# Patient Record
Sex: Female | Born: 1960 | Race: Black or African American | Hispanic: No | Marital: Married
Health system: Southern US, Community
[De-identification: ages and names within clinical notes are randomized; demographics above are authoritative.]

---

## 2008-05-11 ENCOUNTER — Other Ambulatory Visit: Payer: Self-pay

## 2008-05-12 ENCOUNTER — Inpatient Hospital Stay: Payer: Self-pay | Admitting: Internal Medicine

## 2019-12-04 ENCOUNTER — Emergency Department: Payer: Medicaid Other

## 2019-12-04 ENCOUNTER — Inpatient Hospital Stay: Payer: Medicaid Other

## 2019-12-04 ENCOUNTER — Other Ambulatory Visit: Payer: Self-pay

## 2019-12-04 ENCOUNTER — Inpatient Hospital Stay
Admission: EM | Admit: 2019-12-04 | Discharge: 2019-12-26 | DRG: 871 | Disposition: E | Payer: Medicaid Other | Attending: Pulmonary Disease | Admitting: Pulmonary Disease

## 2019-12-04 DIAGNOSIS — J9602 Acute respiratory failure with hypercapnia: Secondary | ICD-10-CM | POA: Diagnosis present

## 2019-12-04 DIAGNOSIS — K7201 Acute and subacute hepatic failure with coma: Secondary | ICD-10-CM

## 2019-12-04 DIAGNOSIS — N179 Acute kidney failure, unspecified: Secondary | ICD-10-CM | POA: Diagnosis present

## 2019-12-04 DIAGNOSIS — R188 Other ascites: Secondary | ICD-10-CM | POA: Diagnosis present

## 2019-12-04 DIAGNOSIS — A419 Sepsis, unspecified organism: Principal | ICD-10-CM | POA: Diagnosis present

## 2019-12-04 DIAGNOSIS — R627 Adult failure to thrive: Secondary | ICD-10-CM | POA: Diagnosis present

## 2019-12-04 DIAGNOSIS — D649 Anemia, unspecified: Secondary | ICD-10-CM | POA: Diagnosis present

## 2019-12-04 DIAGNOSIS — J189 Pneumonia, unspecified organism: Secondary | ICD-10-CM | POA: Diagnosis present

## 2019-12-04 DIAGNOSIS — E46 Unspecified protein-calorie malnutrition: Secondary | ICD-10-CM | POA: Diagnosis present

## 2019-12-04 DIAGNOSIS — K7200 Acute and subacute hepatic failure without coma: Secondary | ICD-10-CM | POA: Diagnosis present

## 2019-12-04 DIAGNOSIS — Z66 Do not resuscitate: Secondary | ICD-10-CM | POA: Diagnosis not present

## 2019-12-04 DIAGNOSIS — Z452 Encounter for adjustment and management of vascular access device: Secondary | ICD-10-CM

## 2019-12-04 DIAGNOSIS — R6521 Severe sepsis with septic shock: Secondary | ICD-10-CM | POA: Diagnosis present

## 2019-12-04 DIAGNOSIS — J9601 Acute respiratory failure with hypoxia: Secondary | ICD-10-CM | POA: Diagnosis present

## 2019-12-04 DIAGNOSIS — Z515 Encounter for palliative care: Secondary | ICD-10-CM | POA: Diagnosis not present

## 2019-12-04 DIAGNOSIS — Z20828 Contact with and (suspected) exposure to other viral communicable diseases: Secondary | ICD-10-CM | POA: Diagnosis present

## 2019-12-04 DIAGNOSIS — G9340 Encephalopathy, unspecified: Secondary | ICD-10-CM | POA: Diagnosis present

## 2019-12-04 DIAGNOSIS — E872 Acidosis, unspecified: Secondary | ICD-10-CM

## 2019-12-04 DIAGNOSIS — G9341 Metabolic encephalopathy: Secondary | ICD-10-CM | POA: Diagnosis present

## 2019-12-04 DIAGNOSIS — D689 Coagulation defect, unspecified: Secondary | ICD-10-CM | POA: Diagnosis present

## 2019-12-04 DIAGNOSIS — Z6824 Body mass index (BMI) 24.0-24.9, adult: Secondary | ICD-10-CM

## 2019-12-04 DIAGNOSIS — R680 Hypothermia, not associated with low environmental temperature: Secondary | ICD-10-CM | POA: Diagnosis present

## 2019-12-04 DIAGNOSIS — Z0189 Encounter for other specified special examinations: Secondary | ICD-10-CM

## 2019-12-04 DIAGNOSIS — R34 Anuria and oliguria: Secondary | ICD-10-CM | POA: Diagnosis present

## 2019-12-04 DIAGNOSIS — J9600 Acute respiratory failure, unspecified whether with hypoxia or hypercapnia: Secondary | ICD-10-CM

## 2019-12-04 LAB — LACTIC ACID, PLASMA
Lactic Acid, Venous: 6.2 mmol/L (ref 0.5–1.9)
Lactic Acid, Venous: 5.1 mmol/L (ref 0.5–1.9)

## 2019-12-04 LAB — BLOOD GAS, ARTERIAL
Acid-base deficit: 9.8 mmol/L — ABNORMAL HIGH (ref 0.0–2.0)
Acid-base deficit: 7.8 mmol/L — ABNORMAL HIGH (ref 0.0–2.0)
Bicarbonate: 16 mmol/L — ABNORMAL LOW (ref 20.0–28.0)
Bicarbonate: 17.6 mmol/L — ABNORMAL LOW (ref 20.0–28.0)
FIO2: 60
FIO2: 0.7
MECHVT: 450 mL
MECHVT: 450 mL
O2 Saturation: 93.3 %
O2 Saturation: 92 %
PEEP: 5 cmH2O
PEEP: 5 cmH2O
Patient temperature: 37
Patient temperature: 37
RATE: 18 resp/min
RATE: 18 resp/min
pCO2 arterial: 34 mmHg (ref 32.0–48.0)
pCO2 arterial: 35 mmHg (ref 32.0–48.0)
pH, Arterial: 7.28 — ABNORMAL LOW (ref 7.350–7.450)
pH, Arterial: 7.31 — ABNORMAL LOW (ref 7.350–7.450)
pO2, Arterial: 77 mmHg — ABNORMAL LOW (ref 83.0–108.0)
pO2, Arterial: 70 mmHg — ABNORMAL LOW (ref 83.0–108.0)

## 2019-12-04 LAB — CBC WITH DIFFERENTIAL/PLATELET
Abs Immature Granulocytes: 0.04 10*3/uL (ref 0.00–0.07)
Basophils Absolute: 0 10*3/uL (ref 0.0–0.1)
Basophils Relative: 0 %
Eosinophils Absolute: 0 10*3/uL (ref 0.0–0.5)
Eosinophils Relative: 0 %
HCT: 24 % — ABNORMAL LOW (ref 36.0–46.0)
Hemoglobin: 7.5 g/dL — ABNORMAL LOW (ref 12.0–15.0)
Immature Granulocytes: 1 %
Lymphocytes Relative: 12 %
Lymphs Abs: 0.7 10*3/uL (ref 0.7–4.0)
MCH: 31.8 pg (ref 26.0–34.0)
MCHC: 31.3 g/dL (ref 30.0–36.0)
MCV: 101.7 fL — ABNORMAL HIGH (ref 80.0–100.0)
Monocytes Absolute: 0.5 10*3/uL (ref 0.1–1.0)
Monocytes Relative: 7 %
Neutro Abs: 5.1 10*3/uL (ref 1.7–7.7)
Neutrophils Relative %: 80 %
Platelets: 72 10*3/uL — ABNORMAL LOW (ref 150–400)
RBC: 2.36 MIL/uL — ABNORMAL LOW (ref 3.87–5.11)
RDW: 29.2 % — ABNORMAL HIGH (ref 11.5–15.5)
WBC: 6.4 10*3/uL (ref 4.0–10.5)
nRBC: 0.3 % — ABNORMAL HIGH (ref 0.0–0.2)

## 2019-12-04 LAB — GLUCOSE, CAPILLARY: Glucose-Capillary: 140 mg/dL — ABNORMAL HIGH (ref 70–99)

## 2019-12-04 LAB — ABO/RH: ABO/RH(D): O POS

## 2019-12-04 LAB — COMPREHENSIVE METABOLIC PANEL
ALT: 58 U/L — ABNORMAL HIGH (ref 0–44)
AST: 111 U/L — ABNORMAL HIGH (ref 15–41)
Albumin: 1.9 g/dL — ABNORMAL LOW (ref 3.5–5.0)
Alkaline Phosphatase: 125 U/L (ref 38–126)
Anion gap: 19 — ABNORMAL HIGH (ref 5–15)
BUN: 22 mg/dL — ABNORMAL HIGH (ref 6–20)
CO2: 16 mmol/L — ABNORMAL LOW (ref 22–32)
Calcium: 8.5 mg/dL — ABNORMAL LOW (ref 8.9–10.3)
Chloride: 98 mmol/L (ref 98–111)
Creatinine, Ser: 1.71 mg/dL — ABNORMAL HIGH (ref 0.44–1.00)
GFR calc Af Amer: 38 mL/min — ABNORMAL LOW (ref >60)
GFR calc non Af Amer: 33 mL/min — ABNORMAL LOW (ref >60)
Glucose, Bld: 119 mg/dL — ABNORMAL HIGH (ref 70–99)
Potassium: 3.7 mmol/L (ref 3.5–5.1)
Sodium: 133 mmol/L — ABNORMAL LOW (ref 135–145)
Total Bilirubin: 9.2 mg/dL — ABNORMAL HIGH (ref 0.3–1.2)
Total Protein: 7.5 g/dL (ref 6.5–8.1)

## 2019-12-04 LAB — PROTIME-INR
INR: 3.2 — ABNORMAL HIGH (ref 0.8–1.2)
Prothrombin Time: 32.9 seconds — ABNORMAL HIGH (ref 11.4–15.2)

## 2019-12-04 LAB — BLOOD GAS, VENOUS
Acid-base deficit: 7.9 mmol/L — ABNORMAL HIGH (ref 0.0–2.0)
Bicarbonate: 16.7 mmol/L — ABNORMAL LOW (ref 20.0–28.0)
FIO2: 100
O2 Saturation: 59 %
Patient temperature: 37
pCO2, Ven: 31 mmHg — ABNORMAL LOW (ref 44.0–60.0)
pH, Ven: 7.34 (ref 7.250–7.430)
pO2, Ven: 33 mmHg (ref 32.0–45.0)

## 2019-12-04 LAB — CBC
HCT: 20.2 % — ABNORMAL LOW (ref 36.0–46.0)
Hemoglobin: 6.2 g/dL — ABNORMAL LOW (ref 12.0–15.0)
MCH: 31.8 pg (ref 26.0–34.0)
MCHC: 30.7 g/dL (ref 30.0–36.0)
MCV: 103.6 fL — ABNORMAL HIGH (ref 80.0–100.0)
Platelets: 74 10*3/uL — ABNORMAL LOW (ref 150–400)
RBC: 1.95 MIL/uL — ABNORMAL LOW (ref 3.87–5.11)
RDW: 29.2 % — ABNORMAL HIGH (ref 11.5–15.5)
WBC: 4.8 10*3/uL (ref 4.0–10.5)
nRBC: 0.6 % — ABNORMAL HIGH (ref 0.0–0.2)

## 2019-12-04 LAB — MRSA PCR SCREENING: MRSA by PCR: NEGATIVE

## 2019-12-04 LAB — ETHANOL: Alcohol, Ethyl (B): <10 mg/dL (ref <10)

## 2019-12-04 LAB — PREPARE RBC (CROSSMATCH)

## 2019-12-04 LAB — HIV ANTIBODY (ROUTINE TESTING W REFLEX): HIV Screen 4th Generation wRfx: NONREACTIVE

## 2019-12-04 LAB — PROCALCITONIN: Procalcitonin: 0.4 ng/mL

## 2019-12-04 LAB — CORTISOL
Cortisol, Plasma: 58 ug/dL
Cortisol, Plasma: 55.4 ug/dL

## 2019-12-04 LAB — RESPIRATORY PANEL BY RT PCR (FLU A&B, COVID)
Influenza A by PCR: NEGATIVE
Influenza B by PCR: NEGATIVE
SARS Coronavirus 2 by RT PCR: NEGATIVE

## 2019-12-04 LAB — TSH: TSH: 0.636 u[IU]/mL (ref 0.350–4.500)

## 2019-12-04 LAB — POC SARS CORONAVIRUS 2 AG: SARS Coronavirus 2 Ag: NEGATIVE

## 2019-12-04 LAB — BRAIN NATRIURETIC PEPTIDE: B Natriuretic Peptide: 116 pg/mL — ABNORMAL HIGH (ref 0.0–100.0)

## 2019-12-04 LAB — AMMONIA: Ammonia: 46 umol/L — ABNORMAL HIGH (ref 9–35)

## 2019-12-04 LAB — APTT: aPTT: 56 seconds — ABNORMAL HIGH (ref 24–36)

## 2019-12-04 MED ORDER — ORAL CARE MOUTH RINSE
15.0000 mL | OROMUCOSAL | Status: DC
  Administered 2019-12-05 (×4): 15 mL via OROMUCOSAL

## 2019-12-04 MED ORDER — VASOPRESSIN 20 UNIT/ML IV SOLN
0.0300 [IU]/min | INTRAVENOUS | Status: DC
  Administered 2019-12-04: 0.03 [IU]/min via INTRAVENOUS
  Filled 2019-12-04 (×2): qty 2

## 2019-12-04 MED ORDER — ACETAMINOPHEN 325 MG PO TABS: 650.0000 mg | ORAL_TABLET | ORAL | Status: DC | PRN

## 2019-12-04 MED ORDER — MIDAZOLAM HCL 2 MG/2ML IJ SOLN
2.0000 mg | INTRAMUSCULAR | Status: DC | PRN
  Administered 2019-12-04 (×2): 2 mg via INTRAVENOUS
  Filled 2019-12-04 (×2): qty 2

## 2019-12-04 MED ORDER — SODIUM CHLORIDE 0.9 % IV SOLN
0.0000 ug/min | INTRAVENOUS | Status: DC
  Filled 2019-12-04: qty 1

## 2019-12-04 MED ORDER — ETOMIDATE 2 MG/ML IV SOLN
10.0000 mg | Freq: Once | INTRAVENOUS | Status: AC
  Administered 2019-12-04: 10 mg via INTRAVENOUS

## 2019-12-04 MED ORDER — SUCCINYLCHOLINE CHLORIDE 20 MG/ML IJ SOLN
80.0000 mg | Freq: Once | INTRAMUSCULAR | Status: AC
  Administered 2019-12-04: 80 mg via INTRAVENOUS

## 2019-12-04 MED ORDER — PANTOPRAZOLE SODIUM 40 MG IV SOLR
40.0000 mg | Freq: Every day | INTRAVENOUS | Status: DC
  Administered 2019-12-04: 40 mg via INTRAVENOUS
  Filled 2019-12-04: qty 40

## 2019-12-04 MED ORDER — SODIUM CHLORIDE 0.9 % IV SOLN
0.0000 ug/min | INTRAVENOUS | Status: DC
  Administered 2019-12-04: 50 ug/min via INTRAVENOUS
  Filled 2019-12-04 (×2): qty 1

## 2019-12-04 MED ORDER — VANCOMYCIN HCL IN DEXTROSE 1-5 GM/200ML-% IV SOLN
1000.0000 mg | Freq: Once | INTRAVENOUS | Status: AC
  Administered 2019-12-04: 1000 mg via INTRAVENOUS
  Filled 2019-12-04: qty 200

## 2019-12-04 MED ORDER — SODIUM CHLORIDE 0.9 % IV SOLN
2.0000 g | INTRAVENOUS | Status: DC
  Administered 2019-12-04: 2 g via INTRAVENOUS
  Filled 2019-12-04: qty 20
  Filled 2019-12-04: qty 2

## 2019-12-04 MED ORDER — FENTANYL 2500MCG IN NS 250ML (10MCG/ML) PREMIX INFUSION
INTRAVENOUS | Status: AC
  Administered 2019-12-04: 100 ug/h via INTRAVENOUS
  Filled 2019-12-04: qty 250

## 2019-12-04 MED ORDER — CHLORHEXIDINE GLUCONATE 0.12% ORAL RINSE (MEDLINE KIT)
15.0000 mL | Freq: Two times a day (BID) | OROMUCOSAL | Status: DC
  Administered 2019-12-04 – 2019-12-05 (×2): 15 mL via OROMUCOSAL

## 2019-12-04 MED ORDER — SODIUM CHLORIDE 0.9 % IV SOLN: 0.0000 ug/min | INTRAVENOUS | Status: DC

## 2019-12-04 MED ORDER — NITROGLYCERIN 2 % TD OINT: 1.0000 [in_us] | TOPICAL_OINTMENT | Freq: Three times a day (TID) | TRANSDERMAL | Status: DC

## 2019-12-04 MED ORDER — SODIUM CHLORIDE 0.9 % IV BOLUS (SEPSIS): 250.0000 mL | Freq: Once | INTRAVENOUS | Status: DC

## 2019-12-04 MED ORDER — CHLORHEXIDINE GLUCONATE CLOTH 2 % EX PADS
6.0000 | MEDICATED_PAD | Freq: Every day | CUTANEOUS | Status: DC
  Administered 2019-12-05: 6 via TOPICAL

## 2019-12-04 MED ORDER — PHENYLEPHRINE HCL-NACL 10-0.9 MG/250ML-% IV SOLN: 0.0000 ug/min | INTRAVENOUS | Status: DC

## 2019-12-04 MED ORDER — SODIUM CHLORIDE 0.9 % IV SOLN
0.0000 ug/min | INTRAVENOUS | Status: DC
  Administered 2019-12-04: 200 ug/min via INTRAVENOUS
  Administered 2019-12-05: 400 ug/min via INTRAVENOUS
  Filled 2019-12-04 (×2): qty 10

## 2019-12-04 MED ORDER — PHENTOLAMINE MESYLATE 5 MG IJ SOLR
5.0000 mg | Freq: Once | INTRAMUSCULAR | Status: AC
  Administered 2019-12-04: 5 mg via SUBCUTANEOUS
  Filled 2019-12-04: qty 5

## 2019-12-04 MED ORDER — SODIUM CHLORIDE 0.9 % IV SOLN
2.0000 g | Freq: Once | INTRAVENOUS | Status: AC
  Administered 2019-12-04: 2 g via INTRAVENOUS
  Filled 2019-12-04: qty 2

## 2019-12-04 MED ORDER — FENTANYL 2500MCG IN NS 250ML (10MCG/ML) PREMIX INFUSION
0.0000 ug/h | INTRAVENOUS | Status: DC
  Administered 2019-12-04 – 2019-12-05 (×2): 250 ug/h via INTRAVENOUS
  Filled 2019-12-04: qty 250

## 2019-12-04 MED ORDER — IPRATROPIUM-ALBUTEROL 0.5-2.5 (3) MG/3ML IN SOLN
3.0000 mL | Freq: Four times a day (QID) | RESPIRATORY_TRACT | Status: DC
  Administered 2019-12-04 – 2019-12-05 (×4): 3 mL via RESPIRATORY_TRACT
  Filled 2019-12-04 (×4): qty 3

## 2019-12-04 MED ORDER — THIAMINE HCL 100 MG/ML IJ SOLN
100.0000 mg | Freq: Once | INTRAMUSCULAR | Status: AC
  Administered 2019-12-04: 100 mg via INTRAVENOUS
  Filled 2019-12-04: qty 2

## 2019-12-04 MED ORDER — SODIUM CHLORIDE 0.9 % IV SOLN
500.0000 mg | INTRAVENOUS | Status: DC
  Administered 2019-12-04: 500 mg via INTRAVENOUS
  Filled 2019-12-04 (×2): qty 500

## 2019-12-04 MED ORDER — METRONIDAZOLE IN NACL 5-0.79 MG/ML-% IV SOLN
500.0000 mg | Freq: Once | INTRAVENOUS | Status: AC
  Administered 2019-12-04: 500 mg via INTRAVENOUS
  Filled 2019-12-04: qty 100

## 2019-12-04 MED ORDER — SODIUM CHLORIDE 0.9 % IV SOLN
0.0000 ug/min | INTRAVENOUS | Status: DC
  Administered 2019-12-04: 60 ug/min via INTRAVENOUS
  Administered 2019-12-04: 10 ug/min via INTRAVENOUS
  Administered 2019-12-04 (×3): 40 ug/min via INTRAVENOUS
  Filled 2019-12-04 (×7): qty 4

## 2019-12-04 MED ORDER — SODIUM CHLORIDE 0.9 % IV BOLUS (SEPSIS)
1000.0000 mL | Freq: Once | INTRAVENOUS | Status: DC
  Administered 2019-12-04: 1000 mL via INTRAVENOUS

## 2019-12-04 MED ORDER — NOREPINEPHRINE 16 MG/250ML-% IV SOLN
0.0000 ug/min | INTRAVENOUS | Status: DC
  Administered 2019-12-04: 50 ug/min via INTRAVENOUS
  Administered 2019-12-05: 20 ug/min via INTRAVENOUS
  Filled 2019-12-04 (×2): qty 250

## 2019-12-04 MED ORDER — FENTANYL BOLUS VIA INFUSION
50.0000 ug | INTRAVENOUS | Status: DC | PRN
  Filled 2019-12-04: qty 50

## 2019-12-04 MED ORDER — MIDAZOLAM HCL 2 MG/2ML IJ SOLN
2.0000 mg | INTRAMUSCULAR | Status: DC | PRN
  Administered 2019-12-04 – 2019-12-05 (×3): 2 mg via INTRAVENOUS
  Filled 2019-12-04: qty 2

## 2019-12-04 MED ORDER — SODIUM CHLORIDE 0.9% IV SOLUTION
Freq: Once | INTRAVENOUS | Status: AC
  Administered 2019-12-04: 23:00:00 via INTRAVENOUS

## 2019-12-04 MED ORDER — STERILE WATER FOR INJECTION IV SOLN
INTRAVENOUS | Status: DC
  Administered 2019-12-04 (×2): via INTRAVENOUS
  Filled 2019-12-04 (×6): qty 850

## 2019-12-04 MED ORDER — ONDANSETRON HCL 4 MG/2ML IJ SOLN: 4.0000 mg | Freq: Four times a day (QID) | INTRAMUSCULAR | Status: DC | PRN

## 2019-12-04 MED ORDER — FENTANYL 2500MCG IN NS 250ML (10MCG/ML) PREMIX INFUSION
25.0000 ug/h | INTRAVENOUS | Status: DC
  Filled 2019-12-04: qty 250

## 2019-12-04 MED ORDER — FENTANYL CITRATE (PF) 100 MCG/2ML IJ SOLN: 50.0000 ug | Freq: Once | INTRAMUSCULAR | Status: DC

## 2019-12-04 MED ORDER — SODIUM CHLORIDE 0.9 % IV BOLUS (SEPSIS)
500.0000 mL | Freq: Once | INTRAVENOUS | Status: AC
  Administered 2019-12-04: 500 mL via INTRAVENOUS

## 2019-12-04 MED ORDER — NOREPINEPHRINE 4 MG/250ML-% IV SOLN
0.0000 ug/min | INTRAVENOUS | Status: DC
  Filled 2019-12-04: qty 250

## 2019-12-04 MED ORDER — THIAMINE HCL 100 MG/ML IJ SOLN
Freq: Once | INTRAVENOUS | Status: DC
  Filled 2019-12-04: qty 1000

## 2019-12-04 MED ORDER — PHENYLEPHRINE HCL-NACL 10-0.9 MG/250ML-% IV SOLN
0.0000 ug/min | INTRAVENOUS | Status: DC
  Filled 2019-12-04: qty 250

## 2019-12-04 MED ORDER — HYDROCORTISONE NA SUCCINATE PF 100 MG IJ SOLR
50.0000 mg | Freq: Four times a day (QID) | INTRAMUSCULAR | Status: DC
  Administered 2019-12-04 – 2019-12-05 (×3): 50 mg via INTRAVENOUS
  Filled 2019-12-04 (×3): qty 2

## 2019-12-04 NOTE — ED Notes (Signed)
Date and time results received: 11/30/2019 0957 (use smartphrase ".now" to insert current time)  Test: Lactic Acid Critical Value: 5.1  Name of Provider Notified: Ellender Hose, MD, via secure message  Orders Received? Or Actions Taken?: No orders received

## 2019-12-04 NOTE — Progress Notes (Signed)
I have attempted to Call the Husband Jeneen Rinks multiple times, there was no answer. Supposedly husband was in the hospital but did NOT come to ICU to visit his wife and left the building.

## 2019-12-04 NOTE — ED Notes (Addendum)
Etomidate 10mg given

## 2019-12-04 NOTE — Progress Notes (Signed)
PHARMACY -  BRIEF ANTIBIOTIC NOTE   Pharmacy has received consult(s) for Vancomycin and Cefepime from an ED provider.  The patient's profile has been reviewed for ht/wt/allergies/indication/available labs.  Pt altered   One time order(s) placed for Cefepime 2 gm and Vancomycin 1 gm by ED MD  Further antibiotics/pharmacy consults should be ordered by admitting physician if indicated.                       Thank you, Kelda Azad A 12/17/2019  8:13 AM

## 2019-12-04 NOTE — ED Notes (Signed)
Noted pt's PIV to LFA infiltrated at 1008, site was infusing Levophed at 54mcg/min. Small amount of swelling noted. No heat noted. Levophed aspirated from PIV, infusion site changed to L EJ. Pt's arm elevated, warm compress not applied d/t pt under Bair hugger, skin very warm. MD Julaine Hua, MD states to call pharmacy regarding protocol. Per pharmacy, monitor site for now per protocol and notify pharmacy if site worsens.

## 2019-12-04 NOTE — Progress Notes (Signed)
Due to national backorder- MVI removed from IV infusion.

## 2019-12-04 NOTE — ED Notes (Signed)
No levophed available in Pyxis. Pharmacy called to send dose.

## 2019-12-04 NOTE — ED Provider Notes (Signed)
[facility] Emergency Department Provider Note  ____________________________________________   First MD Initiated Contact with Patient 15-Sep-2019 0703     (approximate)  I have reviewed the triage vital signs and the nursing notes.   HISTORY  Chief Complaint Shortness of Breath and Altered Mental Status    HPI Susan Fields is a 58 y.o. female  Here with SOB, AMS. History limited 2/2 confusion. Per report, EMS was called to the scene due to SOB. Pt was found hypoxic to 60s on RA with increased WOB. Placed on NRB with improvement to 90-92. Pt also confused, with repetitive questioning. Unclear how long she has been feeling unwell. Pt states to me she does not know her medical history and has no records on Epic.  Level 5 caveat invoked as remainder of history, ROS, and physical exam limited due to patient's AMS.         No past medical history on file.  Patient Active Problem List   Diagnosis Date Noted  . Acute encephalopathy 21-Sep-202020     Prior to Admission medications   Not on File    Allergies Patient has no allergy information on record.  No family history on file.  Social History Social History   Tobacco Use  . Smoking status: Not on file  Substance Use Topics  . Alcohol use: Not on file  . Drug use: Not on file    Review of Systems  Review of Systems  Unable to perform ROS: Mental status change  Constitutional: Positive for fatigue.  Neurological: Positive for weakness.     ____________________________________________  PHYSICAL EXAM:      VITAL SIGNS: ED Triage Vitals [15-Sep-2019 0700]  Enc Vitals Group     BP      Pulse Rate 98     Resp      Temp      Temp src      SpO2 98 %     Weight      Height      Head Circumference      Peak Flow      Pain Score      Pain Loc      Pain Edu?      Excl. in GC?      Physical Exam Vitals and nursing note reviewed.  Constitutional:      General: She is not in  acute distress.    Appearance: She is well-developed.     Comments: Chronically ill appearing, appears older than stated age, confused  HENT:     Head: Normocephalic and atraumatic.     Comments: Markedly dry MM    Mouth/Throat:     Mouth: Mucous membranes are dry.  Eyes:     Conjunctiva/sclera: Conjunctivae normal.  Cardiovascular:     Rate and Rhythm: Normal rate and regular rhythm.     Heart sounds: Normal heart sounds.  Pulmonary:     Effort: Respiratory distress present.     Breath sounds: Rhonchi present. No wheezing.  Abdominal:     General: There is distension.     Tenderness: There is no abdominal tenderness.     Comments: Distended, +fluid wave  Musculoskeletal:     Cervical back: Neck supple.     Right lower leg: Edema present.     Left lower leg: Edema present.  Skin:    General: Skin is warm.     Capillary Refill: Capillary refill takes less than 2 seconds.  Findings: No rash.  Neurological:     Mental Status: She is alert. She is disoriented.     Motor: No abnormal muscle tone.       ____________________________________________   LABS (all labs ordered are listed, but only abnormal results are displayed)  Labs Reviewed  LACTIC ACID, PLASMA - Abnormal; Notable for the following components:      Result Value   Lactic Acid, Venous 6.2 (*)    All other components within normal limits  COMPREHENSIVE METABOLIC PANEL - Abnormal; Notable for the following components:   Sodium 133 (*)    CO2 16 (*)    Glucose, Bld 119 (*)    BUN 22 (*)    Creatinine, Ser 1.71 (*)    Calcium 8.5 (*)    Albumin 1.9 (*)    AST 111 (*)    ALT 58 (*)    Total Bilirubin 9.2 (*)    GFR calc non Af Amer 33 (*)    GFR calc Af Amer 38 (*)    Anion gap 19 (*)    All other components within normal limits  CBC WITH DIFFERENTIAL/PLATELET - Abnormal; Notable for the following components:   RBC 2.36 (*)    Hemoglobin 7.5 (*)    HCT 24.0 (*)    MCV 101.7 (*)    RDW 29.2 (*)     Platelets 72 (*)    nRBC 0.3 (*)    All other components within normal limits  APTT - Abnormal; Notable for the following components:   aPTT 56 (*)    All other components within normal limits  PROTIME-INR - Abnormal; Notable for the following components:   Prothrombin Time 32.9 (*)    INR 3.2 (*)    All other components within normal limits  AMMONIA - Abnormal; Notable for the following components:   Ammonia 46 (*)    All other components within normal limits  BLOOD GAS, VENOUS - Abnormal; Notable for the following components:   pCO2, Ven 31 (*)    Bicarbonate 16.7 (*)    Acid-base deficit 7.9 (*)    All other components within normal limits  BLOOD GAS, ARTERIAL - Abnormal; Notable for the following components:   pH, Arterial 7.28 (*)    pO2, Arterial 77 (*)    Bicarbonate 16.0 (*)    Acid-base deficit 9.8 (*)    All other components within normal limits  CULTURE, BLOOD (ROUTINE X 2)  CULTURE, BLOOD (ROUTINE X 2)  URINE CULTURE  RESPIRATORY PANEL BY RT PCR (FLU A&B, COVID)  CULTURE, BLOOD (ROUTINE X 2)  CULTURE, BLOOD (ROUTINE X 2)  URINE CULTURE  CULTURE, RESPIRATORY  PROCALCITONIN  TSH  LACTIC ACID, PLASMA  URINALYSIS, ROUTINE W REFLEX MICROSCOPIC  CORTISOL  ETHANOL  HIV ANTIBODY (ROUTINE TESTING W REFLEX)  CORTISOL  BRAIN NATRIURETIC PEPTIDE  URINALYSIS, ROUTINE W REFLEX MICROSCOPIC  STREP PNEUMONIAE URINARY ANTIGEN  LEGIONELLA PNEUMOPHILA SEROGP 1 UR AG  POC SARS CORONAVIRUS 2 AG -  ED  POC SARS CORONAVIRUS 2 AG  POC URINE PREG, ED  TYPE AND SCREEN    ____________________________________________  EKG: Normal sinus rhythm, ventricular 96.  QRS 82, QTc 535.  Nonspecific repull abnormality.  No overt ST elevations ________________________________________  RADIOLOGY All imaging, including plain films, CT scans, and ultrasounds, independently reviewed by me, and interpretations confirmed via formal radiology reads.  ED MD interpretation:   Chest x-ray:  Diffuse bilateral infiltrates CT head: Pending  Official radiology report(s): DG Chest [facility]  1 View  Result Date: 12/21/2019 CLINICAL DATA:  Sepsis.  Shortness of breath.  Abdominal pain. EXAM: PORTABLE CHEST 1 VIEW COMPARISON:  No recent prior. FINDINGS: Mediastinum hilar structures normal. Heart size normal. Diffuse severe bilateral pulmonary interstitial infiltrates are noted. No pleural effusion or pneumothorax. Thoracic spine scoliosis concave left. Surgical clips right upper quadrant. Mild gastric distention. IMPRESSION: 1. Diffuse severe bilateral pulmonary interstitial infiltrates are noted. 2.  Mild gastric distention. Electronically Signed   By: Maisie Fus  Register   On: 12/19/2019 08:05    ____________________________________________  PROCEDURES   Procedure(s) performed (including Critical Care):  .Critical Care Performed by: Shaune Pollack, MD Authorized by: Shaune Pollack, MD   Critical care provider statement:    Critical care time (minutes):  75   Critical care time was exclusive of:  Separately billable procedures and treating other patients and teaching time   Critical care was necessary to treat or prevent imminent or life-threatening deterioration of the following conditions:  Cardiac failure, circulatory failure, respiratory failure, sepsis and metabolic crisis   Critical care was time spent personally by me on the following activities:  Development of treatment plan with patient or surrogate, discussions with consultants, evaluation of patient's response to treatment, examination of patient, obtaining history from patient or surrogate, ordering and performing treatments and interventions, ordering and review of laboratory studies, ordering and review of radiographic studies, pulse oximetry, re-evaluation of patient's condition and review of old charts   I assumed direction of critical care for this patient from another provider in my specialty: no   Ultrasound ED  Peripheral IV (Provider)  Date/Time: 12/08/2019 7:49 AM Performed by: Shaune Pollack, MD Authorized by: Shaune Pollack, MD   Procedure details:    Indications: multiple failed IV attempts     Skin Prep: chlorhexidine gluconate     Location:  Right forearm   Angiocath:  20 G   Bedside Ultrasound Guided: Yes     Images: archived     Patient tolerated procedure without complications: Yes     Dressing applied: Yes   Ultrasound ED Peripheral IV (Provider)  Date/Time: 12/19/2019 7:50 AM Performed by: Shaune Pollack, MD Authorized by: Shaune Pollack, MD   Procedure details:    Indications: multiple failed IV attempts     Skin Prep: chlorhexidine gluconate     Location:  Left AC   Angiocath:  20 G   Bedside Ultrasound Guided: Yes     Images: archived     Patient tolerated procedure without complications: Yes     Dressing applied: Yes   Ultrasound ED Peripheral IV (Provider)  Date/Time: 12/09/2019 9:35 AM Performed by: Shaune Pollack, MD Authorized by: Shaune Pollack, MD   Procedure details:    Indications: multiple failed IV attempts     Skin Prep: chlorhexidine gluconate     Location:  Right AC   Angiocath:  20 G   Bedside Ultrasound Guided: Yes     Images: archived     Patient tolerated procedure without complications: Yes     Dressing applied: Yes   Procedure Name: Intubation Date/Time: 12/03/2019 9:35 AM Performed by: Shaune Pollack, MD Pre-anesthesia Checklist: Patient identified, Patient being monitored, Emergency Drugs available, Timeout performed and Suction available Oxygen Delivery Method: Non-rebreather mask Preoxygenation: Pre-oxygenation with 100% oxygen Induction Type: Rapid sequence Ventilation: Mask ventilation without difficulty Laryngoscope Size: Glidescope and 3 Grade View: Grade I Tube size: 7.0 mm Number of attempts: 1 Airway Equipment and Method: Video-laryngoscopy and Rigid stylet Placement Confirmation: ETT inserted through  vocal cords  under direct vision,  CO2 detector and Breath sounds checked- equal and bilateral Tube secured with: ETT holder Dental Injury: Teeth and Oropharynx as per pre-operative assessment  Difficulty Due To: Difficulty was unanticipated Future Recommendations: Recommend- induction with short-acting agent, and alternative techniques readily available       ____________________________________________  INITIAL IMPRESSION / MDM / ASSESSMENT AND PLAN / ED COURSE  As part of my medical decision making, I reviewed the following data within the electronic MEDICAL RECORD NUMBER Nursing notes reviewed and incorporated, Old chart reviewed, Notes from prior ED visits, and [place] Controlled Substance Database       *ARACELIE ADDIS was evaluated in Emergency Department on 06-Dec-2019 for the symptoms described in the history of present illness. She was evaluated in the context of the global COVID-19 pandemic, which necessitated consideration that the patient might be at risk for infection with the SARS-CoV-2 virus that causes COVID-19. Institutional protocols and algorithms that pertain to the evaluation of patients at risk for COVID-19 are in a state of rapid change based on information released by regulatory bodies including the CDC and federal and state organizations. These policies and algorithms were followed during the patient's care in the ED.  Some ED evaluations and interventions may be delayed as a result of limited staffing during the pandemic.*  Clinical Course as of Dec 04 935  Thu 2019/12/06  49 58 yo F here with SOB, confusion, hypoxia. Pt hypothermic, hypotensive, hypoxic on arrival. Sepsis protocol initiated, with warm IVF and BAIR hugger. PIV placed x 2 by myself, will start fluids and broad-spectrum ABX. DDx is broad, includes sepsis, COVID, hepatic failure given abd distension, myxedema, metabolic encephalopathy. No apparent focal neuro deficits. No apparent head trauma.   [CI]    Clinical  Course User Index [CI] Shaune Pollack, MD    Medical Decision Making: 58 year old female here with respiratory failure and encephalopathy.  Initial differential as above.  Lab work shows likely decompensated liver disease with marked lactic acidosis, AKI with acidemia, anemia, and elevated INR.  Patient placed on Bair hugger, given fluids, thiamine, and intubated for airway protection as patient became increasingly confused after warming and improving blood pressure.  CT head, repeat chest x-ray pending.  Admit to the ICU.  Covid negative.  ____________________________________________  FINAL CLINICAL IMPRESSION(S) / ED DIAGNOSES  Final diagnoses:  Acute respiratory failure with hypoxia (HCC)  Encephalopathy  Sepsis with acute renal failure and septic shock, due to unspecified organism, unspecified acute renal failure type (HCC)     MEDICATIONS GIVEN DURING THIS VISIT:  Medications  sodium bicarbonate 150 mEq in sterile water 1,000 mL infusion (has no administration in time range)  norepinephrine (LEVOPHED) 4 mg in sodium chloride 0.9 % 250 mL (0.016 mg/mL) infusion (5 mcg/min Intravenous Rate/Dose Change Dec 06, 2019 0917)  fentaNYL in NS (60mcg/ml) infusion-PREMIX (100 mcg/hr Intravenous New Bag/Given 12/06/2019 0928)  fentaNYL (SUBLIMAZE) injection 50 mcg (has no administration in time range)  fentaNYL in NS (19mcg/ml) infusion-PREMIX (has no administration in time range)  fentaNYL (SUBLIMAZE) bolus via infusion 50 mcg (has no administration in time range)  midazolam (VERSED) injection 2 mg (has no administration in time range)  midazolam (VERSED) injection 2 mg (has no administration in time range)  pantoprazole (PROTONIX) injection 40 mg (has no administration in time range)  acetaminophen (TYLENOL) tablet 650 mg (has no administration in time range)  ondansetron (ZOFRAN) injection 4 mg (has no administration in time range)  ipratropium-albuterol (DUONEB)  0.5-2.5 (3) MG/3ML nebulizer solution 3 mL (has no administration in time range)  sodium chloride 0.9 % 1,000 mL with thiamine 100 mg, folic acid 1 mg infusion (has no administration in time range)  sodium chloride 0.9 % bolus 500 mL (0 mLs Intravenous Stopped 01-01-2020 0822)  ceFEPIme (MAXIPIME) 2 g in sodium chloride 0.9 % 100 mL IVPB (0 g Intravenous Stopped 01-01-20 0828)  metroNIDAZOLE (FLAGYL) IVPB 500 mg (0 mg Intravenous Stopped 2020/01/01 0910)  vancomycin (VANCOCIN) IVPB 1000 mg/200 mL premix (0 mg Intravenous Stopped 2020/01/01 0910)  thiamine (B-1) injection 100 mg (100 mg Intravenous Given 01-01-2020 0829)  succinylcholine (ANECTINE) injection 80 mg (80 mg Intravenous Given 2020/01/01 0905)  etomidate (AMIDATE) injection 10 mg (10 mg Intravenous Given 01/01/20 2706)     ED Discharge Orders    None       Note:  This document was prepared using Dragon voice recognition software and may include unintentional dictation errors.   Shaune Pollack, MD Jan 01, 2020 [phone]

## 2019-12-04 NOTE — H&P (Signed)
Susan Fields is an 58 y.o. female.    Chief Complaint: Acute encephalopathy HPI: Patient is a 58 year old woman who presented with dyspnea and altered mental status.  History cannot be obtained from the patient.  There is no family members present.  Patient has no significant records in Epic.  Per the ED physicians report, EMS was called at the scene due to shortness of breath.  Apparently the patient called EMS.  He was noted to be hypoxic with oxygen saturations in the 60s on room air.  She had obvious increased work of breathing according to EMS.  The patient was placed on nonrebreather with improvement of saturations at 90 to 92%.  She was noted to be confused and apparently exhibited echolalia.  Uncertain how long the patient had been ill.  Patient could not give significant medical history to ED physician.  The patient had to be intubated in the emergency room due to worsening respiratory distress and hypoxia.  No further history is available.  Currently the patient is intubated, unresponsive, and receiving pressors via peripheral IV.  No past medical history on file.  No family history on file.  Social History:  has no history on file for tobacco, alcohol, and drug.  Allergies: Not on File  Outpatient medications: Unknown.  Current medications reviewed.  Results for orders placed or performed during the hospital encounter of 11/25/2019 (from the past 48 hour(s))  Lactic acid, plasma     Status: Abnormal   Collection Time: 12/07/2019  7:10 AM  Result Value Ref Range   Lactic Acid, Venous 6.2 (HH) 0.5 - 1.9 mmol/L    Comment: CRITICAL RESULT CALLED TO, READ BACK BY AND VERIFIED WITH NOAH GRIFFITH ON 12/25/2019 AT 0808 QSD Performed at [facility], [address] [place], [place] [address]   Comprehensive metabolic panel     Status: Abnormal   Collection Time: 12/10/2019  7:10 AM  Result Value Ref Range   Sodium 133 (L) 135 - 145 mmol/L   Potassium 3.7 3.5 - 5.1 mmol/L    Chloride 98 98 - 111 mmol/L   CO2 16 (L) 22 - 32 mmol/L   Glucose, Bld 119 (H) 70 - 99 mg/dL   BUN 22 (H) 6 - 20 mg/dL   Creatinine, Ser 5.68 (H) 0.44 - 1.00 mg/dL   Calcium 8.5 (L) 8.9 - 10.3 mg/dL   Total Protein 7.5 6.5 - 8.1 g/dL   Albumin 1.9 (L) 3.5 - 5.0 g/dL   AST 127 (H) 15 - 41 U/L   ALT 58 (H) 0 - 44 U/L   Alkaline Phosphatase 125 38 - 126 U/L   Total Bilirubin 9.2 (H) 0.3 - 1.2 mg/dL   GFR calc non Af Amer 33 (L) >60 mL/min   GFR calc Af Amer 38 (L) >60 mL/min   Anion gap 19 (H) 5 - 15    Comment: Performed at [facility], [address] [place], [place] [address]  CBC WITH DIFFERENTIAL     Status: Abnormal   Collection Time: 12/15/2019  7:10 AM  Result Value Ref Range   WBC 6.4 4.0 - 10.5 K/uL   RBC 2.36 (L) 3.87 - 5.11 MIL/uL   Hemoglobin 7.5 (L) 12.0 - 15.0 g/dL   HCT 17.4 (L) 94.4 - 96.7 %   MCV 101.7 (H) 80.0 - 100.0 fL   MCH 31.8 26.0 - 34.0 pg   MCHC 31.3 30.0 - 36.0 g/dL   RDW 59.1 (H) 63.8 - 46.6 %  Platelets 72 (L) 150 - 400 K/uL    Comment: Immature Platelet Fraction may be clinically indicated, consider ordering this additional test [id number]    nRBC 0.3 (H) 0.0 - 0.2 %   Neutrophils Relative % 80 %   Neutro Abs 5.1 1.7 - 7.7 K/uL   Lymphocytes Relative 12 %   Lymphs Abs 0.7 0.7 - 4.0 K/uL   Monocytes Relative 7 %   Monocytes Absolute 0.5 0.1 - 1.0 K/uL   Eosinophils Relative 0 %   Eosinophils Absolute 0.0 0.0 - 0.5 K/uL   Basophils Relative 0 %   Basophils Absolute 0.0 0.0 - 0.1 K/uL   WBC Morphology MORPHOLOGY UNREMARKABLE    Smear Review PLATELET COUNT CONFIRMED BY SMEAR    Immature Granulocytes 1 %   Abs Immature Granulocytes 0.04 0.00 - 0.07 K/uL   Polychromasia PRESENT    Target Cells PRESENT     Comment: Performed at [facility], [address] [place], [place] [address]  APTT     Status: Abnormal   Collection Time: December 25, 2019  7:10 AM  Result Value Ref Range   aPTT 56 (H) 24 - 36 seconds    Comment:         IF BASELINE aPTT IS ELEVATED, SUGGEST PATIENT RISK ASSESSMENT BE USED TO DETERMINE APPROPRIATE ANTICOAGULANT THERAPY. Performed at [facility], [address] [place], [place] [address]   Protime-INR     Status: Abnormal   Collection Time: 12/25/19  7:10 AM  Result Value Ref Range   Prothrombin Time 32.9 (H) 11.4 - 15.2 seconds   INR 3.2 (H) 0.8 - 1.2    Comment: (NOTE) INR goal varies based on device and disease states. Performed at [facility], [address] [place], [place] [address]   Procalcitonin     Status: None   Collection Time: 12/25/2019  7:10 AM  Result Value Ref Range   Procalcitonin 0.40 ng/mL    Comment:        Interpretation: PCT (Procalcitonin) <= 0.5 ng/mL: Systemic infection (sepsis) is not likely. Local bacterial infection is possible. (NOTE)       Sepsis PCT Algorithm           Lower Respiratory Tract                                      Infection PCT Algorithm    ----------------------------     ----------------------------         PCT < 0.25 ng/mL                PCT < 0.10 ng/mL         Strongly encourage             Strongly discourage   discontinuation of antibiotics    initiation of antibiotics    ----------------------------     -----------------------------       PCT 0.25 - 0.50 ng/mL            PCT 0.10 - 0.25 ng/mL               OR       >80% decrease in PCT            Discourage initiation of  antibiotics      Encourage discontinuation           of antibiotics    ----------------------------     -----------------------------         PCT >= 0.50 ng/mL              PCT 0.26 - 0.50 ng/mL               AND        <80% decrease in PCT             Encourage initiation of                                             antibiotics       Encourage continuation           of antibiotics    ----------------------------     -----------------------------        PCT >= 0.50 ng/mL                   PCT > 0.50 ng/mL               AND         increase in PCT                  Strongly encourage                                      initiation of antibiotics    Strongly encourage escalation           of antibiotics                                     -----------------------------                                           PCT <= 0.25 ng/mL                                                 OR                                        > 80% decrease in PCT                                     Discontinue / Do not initiate                                             antibiotics Performed at [facility], [address], [place], [place] [address]   Ammonia     Status: Abnormal   Collection Time: 12/08/2019  7:10  AM  Result Value Ref Range   Ammonia 46 (H) 9 - 35 umol/L    Comment: ICTERUS AT THIS LEVEL MAY AFFECT RESULT Performed at [facility], [address] [place], [place] [address]   TSH     Status: None   Collection Time: 03/28/2019  7:10 AM  Result Value Ref Range   TSH 0.636 0.350 - 4.500 uIU/mL    Comment: Performed by a 3rd Generation assay with a functional sensitivity of <=0.01 uIU/mL. Performed at [facility], [address] [place], [place] [address]   Brain natriuretic peptide     Status: Abnormal   Collection Time: 03/28/2019  7:10 AM  Result Value Ref Range   B Natriuretic Peptide 116.0 (H) 0.0 - 100.0 pg/mL    Comment: Performed at [facility], [address] [place], [place] [address]  Blood gas, venous     Status: Abnormal   Collection Time: 03/28/2019  7:31 AM  Result Value Ref Range   FIO2 100.00    Delivery systems NON-REBREATHER OXYGEN MASK    pH, Ven 7.34 7.250 - 7.430   pCO2, Ven 31 (L) 44.0 - 60.0 mmHg   pO2, Ven 33.0 32.0 - 45.0 mmHg   Bicarbonate 16.7 (L) 20.0 - 28.0 mmol/L   Acid-base deficit 7.9 (H) 0.0 - 2.0 mmol/L   O2 Saturation 59.0 %   Patient temperature 37.0    Collection site VENOUS    Sample type  VENOUS     Comment: Performed at [facility], [address] [place], [place] [address]  POC SARS Coronavirus 2 Ag     Status: None   Collection Time: 03/28/2019  8:22 AM  Result Value Ref Range   SARS Coronavirus 2 Ag NEGATIVE NEGATIVE    Comment: (NOTE) SARS-CoV-2 antigen NOT DETECTED.  Negative results are presumptive.  Negative results do not preclude SARS-CoV-2 infection and should not be used as the sole basis for treatment or other patient management decisions, including infection  control decisions, particularly in the presence of clinical signs and  symptoms consistent with COVID-19, or in those who have been in contact with the virus.  Negative results must be combined with clinical observations, patient history, and epidemiological information. The expected result is Negative. Fact Sheet for Patients: https://sanders-williams.net/https://www.fda.gov/media/139754/download Fact Sheet for Healthcare Providers: https://martinez.com/https://www.fda.gov/media/139753/download This test is not yet approved or cleared by the [place] FDA and  has been authorized for detection and/or diagnosis of SARS-CoV-2 by FDA under an Emergency Use Authorization (EUA).  This EUA will remain in effect (meaning this test can be used) for the duration of  the COVID-19 de claration under Section 564(b)(1) of the Act, 21 U.S.C. section 360bbb-3(b)(1), unless the authorization is terminated or revoked sooner.   Blood gas, arterial     Status: Abnormal   Collection Time: 03/28/2019  9:17 AM  Result Value Ref Range   FIO2 60.00    Delivery systems VENTILATOR    Mode ASSIST CONTROL    VT 450 mL   LHR 18 resp/min   Peep/cpap 5.0 cm H20   pH, Arterial 7.28 (L) 7.350 - 7.450   pCO2 arterial 34 32.0 - 48.0 mmHg   pO2, Arterial 77 (L) 83.0 - 108.0 mmHg   Bicarbonate 16.0 (L) 20.0 - 28.0 mmol/L   Acid-base deficit 9.8 (H) 0.0 - 2.0 mmol/L   O2 Saturation 93.3 %   Patient temperature 37.0    Collection site LEFT RADIAL    Sample type  ARTERIAL DRAW    Allens test (pass/fail) PASS PASS    Comment: Performed at [facility], [address] [place], [place] [address]  Lactic acid, plasma     Status: Abnormal   Collection Time: 12/20/2019  9:26 AM  Result Value Ref Range   Lactic Acid, Venous 5.1 (HH) 0.5 - 1.9 mmol/L    Comment: CRITICAL VALUE NOTED. VALUE IS CONSISTENT WITH PREVIOUSLY REPORTED/CALLED VALUE JJB Performed at [facility], [address] [place], [place] [address]   Ethanol     Status: None   Collection Time: 12/07/2019  9:26 AM  Result Value Ref Range   Alcohol, Ethyl (B) <10 <10 mg/dL    Comment: (NOTE) Lowest detectable limit for serum alcohol is 10 mg/dL. For medical purposes only. Performed at [facility], [address] [place], [place] [address]    DG Chest Portable 1 View  Result Date: 12/03/2019 CLINICAL DATA:  Respiratory failure EXAM: PORTABLE CHEST 1 VIEW COMPARISON:  Radiograph same day FINDINGS: The heart size and mediastinal contours are unchanged. Again noted are bilateral ground-glass airspace opacities. There is interval placement of ET tube 7 mm above the level of the carina. A fracture deformity of the left shoulder seen. IMPRESSION: ETT 7 mm above the level of the carina. Diffuse bilateral hazy/ground-glass opacities throughout both lungs. This could be due to multifocal infectious etiology, asymmetric edema, and/or early ARDS. Electronically Signed   By: Jonna Clark M.D.   On: 11/27/2019 10:11   DG Chest Port 1 View  Result Date: 12/22/2019 CLINICAL DATA:  Sepsis.  Shortness of breath.  Abdominal pain. EXAM: PORTABLE CHEST 1 VIEW COMPARISON:  No recent prior. FINDINGS: Mediastinum hilar structures normal. Heart size normal. Diffuse severe bilateral pulmonary interstitial infiltrates are noted. No pleural effusion or pneumothorax. Thoracic spine scoliosis concave left. Surgical clips right upper quadrant. Mild gastric distention. IMPRESSION: 1. Diffuse  severe bilateral pulmonary interstitial infiltrates are noted. 2.  Mild gastric distention. Electronically Signed   By: Maisie Fus  Register   On: 12/12/2019 08:05   CBC    Component Value Date/Time   WBC 6.4 12/01/2019 0710   RBC 2.36 (L) 11/28/2019 0710   HGB 7.5 (L) 12/08/2019 0710   HCT 24.0 (L) 11/27/2019 0710   PLT 72 (L) 12/03/2019 0710   MCV 101.7 (H) 12/19/2019 0710   MCH 31.8 11/28/2019 0710   MCHC 31.3 12/20/2019 0710   RDW 29.2 (H) 12/12/2019 0710   LYMPHSABS 0.7 12/02/2019 0710   MONOABS 0.5 12/25/2019 0710   EOSABS 0.0 12/25/2019 0710   BASOSABS 0.0 12/21/2019 0710   BMP Latest Ref Rng & Units 12/11/2019  Glucose 70 - 99 mg/dL 914(N)  BUN 6 - 20 mg/dL 82(N)  Creatinine 5.62 - 1.00 mg/dL 1.30(Q)  Sodium 657 - 846 mmol/L 133(L)  Potassium 3.5 - 5.1 mmol/L 3.7  Chloride 98 - 111 mmol/L 98  CO2 22 - 32 mmol/L 16(L)  Calcium 8.9 - 10.3 mg/dL 9.6(E)     Review of Systems  Unable to perform ROS: Intubated    Blood pressure (!) 84/49, pulse (!) 101, temperature (!) 95.3 F (35.2 C), resp. rate (!) 22, height  (1.575 m), weight 54.4 kg, SpO2 92 %. Physical Exam  Constitutional: She appears well-developed. She appears toxic. She appears ill. She is intubated.  Disheveled appearance, looks markedly older than stated age  HENT:  Head: Normocephalic and atraumatic.  Right Ear: External ear normal.  Left Ear: External ear normal.  Nose:  Nose normal.  Mouth/Throat: Mucous membranes are dry.  Orotracheally intubated, poor dentition  Cardiovascular: Normal rate, regular rhythm, S1 normal, S2 normal and normal heart sounds. Exam reveals no decreased pulses.  Poor capillary refill.  Respiratory: She is intubated. She has no wheezes. She has rhonchi. She has rales.  GI: Soft. She exhibits distension and fluid wave. Bowel sounds are decreased.  Genitourinary:    Genitourinary Comments: Foley in place   Musculoskeletal:        General: Edema present.     Comments:  Muscle wasting noted, no joint deformity.  Neurological: She is unresponsive.  No further assessment can be made.  Per ED physician noted to be encephalopathic  Skin: Skin is dry. Nails show no clubbing.  Hypothermic  Psychiatric:  Unresponsive cannot make assessment.     Assessment/Plan  58 yo AAF with acute severe resp failure from hypoxia with probable aspiration pneumonia and SBP with underlying acute renal failure and liver failure with severe metabolic encephalopathy and severe acidosis    Severe ACUTE Hypoxic and Hypercapnic Respiratory Failure from severe acidosis from septic shock likely aspiration pneumonia -continue Mechanical Ventilator support -continue Bronchodilator Therapy -Wean Fio2 and PEEP as tolerated -VAP/VENT bundle implementation  ACUTE KIDNEY INJURY/Renal Failure -follow chem 7 -follow UO -continue Foley Catheter-assess need -Avoid nephrotoxic agents   Septic shock -use vasopressors to keep MAP>65 -follow ABG and LA -follow up cultures -emperic ABX -consider stress dose steroids -aggressive IV fluid Resuscitation Will place CVL   NEUROLOGY - intubated and sedated - minimal sedation to achieve a RASS goal: -1   ENDO - ICU hypoglycemic\Hyperglycemia protocol -check FSBS per protocol  ELECTROLYTES -follow labs as needed -replace as needed -pharmacy consultation and following    DVT/GI PRX ordered TRANSFUSIONS AS NEEDED MONITOR FSBS ASSESS the need for LABS as needed   GI-acute liver failure possible SBP GI PROPHYLAXIS as indicated  NUTRITIONAL STATUS DIET-->NPO Constipation protocol as indicated     Critical Care Time devoted to patient care services described in this note is 42 minutes.   Overall, patient is critically ill, prognosis is guarded.  Patient with Multiorgan failure and at high risk for cardiac arrest and death.    Lucie Leather, M.D.  [facility]  Medical Director [facility]  [facility] Medical Director [facility] Cardio-Pulmonary Department

## 2019-12-04 NOTE — ED Notes (Signed)
Date and time results received: 11/27/2019 0817 (use smartphrase ".now" to insert current time)  Test: Lactic Acid Critical Value: 6.2  Name of Provider Notified: Ellender Hose, MD  Orders Received? Or Actions Taken?: No orders received

## 2019-12-04 NOTE — ED Notes (Signed)
Noted no urine output in foley at this time.

## 2019-12-04 NOTE — ED Notes (Signed)
Per pharmacy, place orders for extravasation meds per protocol. Pharm staff to send information on administration.

## 2019-12-04 NOTE — Progress Notes (Signed)
CODE SEPSIS - PHARMACY COMMUNICATION  **Broad Spectrum Antibiotics should be administered within 1 hour of Sepsis diagnosis**  Time Code Sepsis Called/Page Received: 12/10 0729  Antibiotics Ordered: Vanc/Cefepime  Time of 1st antibiotic administration: 0756  Additional action taken by pharmacy:     If necessary, Name of Provider/Nurse Contacted:      Noralee Space ,PharmD Clinical Pharmacist  12-12-2019  8:26 AM

## 2019-12-04 NOTE — Progress Notes (Signed)
Transported to CCU on vent with no complications.

## 2019-12-04 NOTE — Procedures (Signed)
Central Venous Catheter Placement:TRIPLE LUMEN Indication: Patient receiving vesicant or irritant drug.; Patient receiving intravenous therapy for longer than 5 days.; Patient has limited or no vascular access.   Consent:emergent    Hand washing performed prior to starting the procedure.   Procedure:   An active timeout was performed and correct patient, name, & ID confirmed.   Patient was positioned correctly for central venous access.  Patient was prepped using strict sterile technique including chlorohexadine preps, sterile drape, sterile gown and sterile gloves.    The area was prepped, draped and anesthetized in the usual sterile manner. Patient comfort was obtained.    A triple lumen catheter was placed in RT  Internal Jugular Vein There was good blood return, catheter caps were placed on lumens, catheter flushed easily, the line was secured and a sterile dressing and BIO-PATCH applied.   Ultrasound was used to visualize vasculature and guidance of needle.   Number of Attempts: 1 Complications:none Estimated Blood Loss: none Chest Radiograph indicated and ordered.  Operator: Jamarii Banks.   Dagmar Adcox David Cayley Pester, M.D.  [facility]  Medical Director [facility] [place] Medical Director [facility] Cardio-Pulmonary Department     

## 2019-12-04 NOTE — ED Triage Notes (Signed)
Pt to ED via [facility]. Pt c/o SOB, abdominal pain since saturday. Pt oxygen saturation on RA was 68%, EMS placed pt on NRB and oxygen saturation increased to 99%. Per EMS pt is alert but is altered from baseline. EMS states pt NSR, CBG 124, BP 93/65 and tachypenic. EMS unable to obtain temperature and IV access.   Upon arrival pt's eyes are jaundiced. Pt appears confused. Pt's breathing is regular and labored. Pt's abdomen is distended upon palpation. Unable to obtain IV access at this time.

## 2019-12-04 NOTE — Progress Notes (Signed)
Pt arrived on unit at this time. Pt on vent and maxed out on levo gtt. SBP in the 80s, Dr. Mortimer Fries is aware. Fentanyl gtt infusing and pt withdraws to pain. RT at bedside.

## 2019-12-05 ENCOUNTER — Inpatient Hospital Stay
Admit: 2019-12-05 | Discharge: 2019-12-05 | Disposition: A | Discharge status: Home / Self Care | Payer: Medicaid Other | Attending: Pulmonary Disease | Admitting: Pulmonary Disease

## 2019-12-05 ENCOUNTER — Inpatient Hospital Stay: Payer: Medicaid Other

## 2019-12-05 LAB — COMPREHENSIVE METABOLIC PANEL
ALT: 68 U/L — ABNORMAL HIGH (ref 0–44)
AST: 190 U/L — ABNORMAL HIGH (ref 15–41)
Albumin: 1.6 g/dL — ABNORMAL LOW (ref 3.5–5.0)
Alkaline Phosphatase: 115 U/L (ref 38–126)
Anion gap: 19 — ABNORMAL HIGH (ref 5–15)
BUN: 24 mg/dL — ABNORMAL HIGH (ref 6–20)
CO2: 15 mmol/L — ABNORMAL LOW (ref 22–32)
Calcium: 7.1 mg/dL — ABNORMAL LOW (ref 8.9–10.3)
Chloride: 100 mmol/L (ref 98–111)
Creatinine, Ser: 2.5 mg/dL — ABNORMAL HIGH (ref 0.44–1.00)
GFR calc Af Amer: 24 mL/min — ABNORMAL LOW (ref >60)
GFR calc non Af Amer: 21 mL/min — ABNORMAL LOW (ref >60)
Glucose, Bld: 109 mg/dL — ABNORMAL HIGH (ref 70–99)
Potassium: 4.2 mmol/L (ref 3.5–5.1)
Sodium: 134 mmol/L — ABNORMAL LOW (ref 135–145)
Total Bilirubin: 8.2 mg/dL — ABNORMAL HIGH (ref 0.3–1.2)
Total Protein: 6.5 g/dL (ref 6.5–8.1)

## 2019-12-05 LAB — TYPE AND SCREEN
ABO/RH(D): O POS
Antibody Screen: NEGATIVE
Unit division: 0

## 2019-12-05 LAB — BLOOD GAS, ARTERIAL
Acid-base deficit: 14 mmol/L — ABNORMAL HIGH (ref 0.0–2.0)
Acid-base deficit: 12.1 mmol/L — ABNORMAL HIGH (ref 0.0–2.0)
Bicarbonate: 14.3 mmol/L — ABNORMAL LOW (ref 20.0–28.0)
Bicarbonate: 16.4 mmol/L — ABNORMAL LOW (ref 20.0–28.0)
FIO2: 100
FIO2: 0.9
MECHVT: 450 mL
MECHVT: 450 mL
O2 Saturation: 93.9 %
O2 Saturation: 90.3 %
PEEP: 10 cmH2O
PEEP: 10 cmH2O
Patient temperature: 37
Patient temperature: 37
RATE: 18 resp/min
RATE: 18 resp/min
pCO2 arterial: 41 mmHg (ref 32.0–48.0)
pCO2 arterial: 46 mmHg (ref 32.0–48.0)
pH, Arterial: 7.15 — CL (ref 7.350–7.450)
pH, Arterial: 7.16 — CL (ref 7.350–7.450)
pO2, Arterial: 90 mmHg (ref 83.0–108.0)
pO2, Arterial: 76 mmHg — ABNORMAL LOW (ref 83.0–108.0)

## 2019-12-05 LAB — LACTIC ACID, PLASMA
Lactic Acid, Venous: >11 mmol/L (ref 0.5–1.9)
Lactic Acid, Venous: >11 mmol/L (ref 0.5–1.9)

## 2019-12-05 LAB — CBC
HCT: 28 % — ABNORMAL LOW (ref 36.0–46.0)
Hemoglobin: 8.5 g/dL — ABNORMAL LOW (ref 12.0–15.0)
MCH: 31.5 pg (ref 26.0–34.0)
MCHC: 30.4 g/dL (ref 30.0–36.0)
MCV: 103.7 fL — ABNORMAL HIGH (ref 80.0–100.0)
Platelets: 72 10*3/uL — ABNORMAL LOW (ref 150–400)
RBC: 2.7 MIL/uL — ABNORMAL LOW (ref 3.87–5.11)
RDW: 26.2 % — ABNORMAL HIGH (ref 11.5–15.5)
WBC: 5 10*3/uL (ref 4.0–10.5)
nRBC: 2 % — ABNORMAL HIGH (ref 0.0–0.2)

## 2019-12-05 LAB — BPAM RBC
Blood Product Expiration Date: 202101122359
ISSUE DATE / TIME: 202012102245
Unit Type and Rh: 5100

## 2019-12-05 LAB — PHOSPHORUS: Phosphorus: 6.9 mg/dL — ABNORMAL HIGH (ref 2.5–4.6)

## 2019-12-05 LAB — MAGNESIUM: Magnesium: 2.1 mg/dL (ref 1.7–2.4)

## 2019-12-05 LAB — ECHOCARDIOGRAM COMPLETE
Height: 62.008 in
Weight: 2186.96 oz

## 2019-12-05 LAB — HEPATITIS B SURFACE ANTIGEN: Hepatitis B Surface Ag: NONREACTIVE

## 2019-12-05 LAB — HEPATITIS B SURFACE ANTIBODY,QUALITATIVE: Hep B S Ab: REACTIVE — AB

## 2019-12-05 LAB — PROTIME-INR
INR: 4.6 (ref 0.8–1.2)
INR: 6.5 (ref 0.8–1.2)
Prothrombin Time: 43.7 seconds — ABNORMAL HIGH (ref 11.4–15.2)
Prothrombin Time: 57.1 seconds — ABNORMAL HIGH (ref 11.4–15.2)

## 2019-12-05 LAB — APTT: aPTT: 70 seconds — ABNORMAL HIGH (ref 24–36)

## 2019-12-05 LAB — TRIGLYCERIDES: Triglycerides: 55 mg/dL (ref <150)

## 2019-12-05 LAB — CK: Total CK: 64 U/L (ref 38–234)

## 2019-12-05 MED ORDER — VANCOMYCIN HCL IN DEXTROSE 1-5 GM/200ML-% IV SOLN
1000.0000 mg | INTRAVENOUS | Status: AC
  Administered 2019-12-05: 1000 mg via INTRAVENOUS
  Filled 2019-12-05: qty 200

## 2019-12-05 MED ORDER — THIAMINE HCL 100 MG/ML IJ SOLN: 100.0000 mg | Freq: Every day | INTRAMUSCULAR | Status: DC

## 2019-12-05 MED ORDER — GLYCOPYRROLATE 1 MG PO TABS
1.0000 mg | ORAL_TABLET | ORAL | Status: DC | PRN
  Filled 2019-12-05: qty 1

## 2019-12-05 MED ORDER — VANCOMYCIN VARIABLE DOSE PER UNSTABLE RENAL FUNCTION (PHARMACIST DOSING): Status: DC

## 2019-12-05 MED ORDER — SODIUM BICARBONATE 8.4 % IV SOLN
INTRAVENOUS | Status: AC
  Administered 2019-12-05: 50 meq via INTRAVENOUS
  Filled 2019-12-05: qty 50

## 2019-12-05 MED ORDER — ONDANSETRON HCL 4 MG/2ML IJ SOLN: 4.0000 mg | Freq: Four times a day (QID) | INTRAMUSCULAR | Status: DC | PRN

## 2019-12-05 MED ORDER — GLYCOPYRROLATE 0.2 MG/ML IJ SOLN: 0.2000 mg | INTRAMUSCULAR | Status: DC | PRN

## 2019-12-05 MED ORDER — VECURONIUM BROMIDE 10 MG IV SOLR: 10.0000 mg | Freq: Once | INTRAVENOUS | Status: AC

## 2019-12-05 MED ORDER — SODIUM BICARBONATE 8.4 % IV SOLN: 50.0000 meq | Freq: Once | INTRAVENOUS | Status: AC

## 2019-12-05 MED ORDER — THIAMINE HCL 100 MG/ML IJ SOLN
Freq: Every day | INTRAVENOUS | Status: DC
  Filled 2019-12-05: qty 0.2

## 2019-12-05 MED ORDER — PROPOFOL 1000 MG/100ML IV EMUL
5.0000 ug/kg/min | INTRAVENOUS | Status: DC
  Administered 2019-12-05: 5 ug/kg/min via INTRAVENOUS
  Administered 2019-12-05: 60 ug/kg/min via INTRAVENOUS
  Filled 2019-12-05 (×2): qty 100

## 2019-12-05 MED ORDER — VECURONIUM BROMIDE 10 MG IV SOLR
INTRAVENOUS | Status: AC
  Administered 2019-12-05: 10 mg via INTRAVENOUS
  Filled 2019-12-05: qty 10

## 2019-12-05 MED ORDER — LACTATED RINGERS IV BOLUS
1000.0000 mL | Freq: Once | INTRAVENOUS | Status: AC
  Administered 2019-12-05: 1000 mL via INTRAVENOUS

## 2019-12-05 MED ORDER — BIOTENE DRY MOUTH MT LIQD: 15.0000 mL | OROMUCOSAL | Status: DC | PRN

## 2019-12-05 MED ORDER — POLYVINYL ALCOHOL 1.4 % OP SOLN
1.0000 [drp] | Freq: Four times a day (QID) | OPHTHALMIC | Status: DC | PRN
  Filled 2019-12-05: qty 15

## 2019-12-05 MED ORDER — ONDANSETRON 4 MG PO TBDP
4.0000 mg | ORAL_TABLET | Freq: Four times a day (QID) | ORAL | Status: DC | PRN
  Filled 2019-12-05: qty 1

## 2019-12-05 MED ORDER — METRONIDAZOLE IN NACL 5-0.79 MG/ML-% IV SOLN
500.0000 mg | Freq: Four times a day (QID) | INTRAVENOUS | Status: DC
  Administered 2019-12-05: 500 mg via INTRAVENOUS
  Filled 2019-12-05 (×4): qty 100

## 2019-12-06 LAB — HCV COMMENT:

## 2019-12-06 LAB — HEPATITIS C ANTIBODY (REFLEX): HCV Ab: 0.1 s/co ratio (ref 0.0–0.9)

## 2019-12-09 LAB — CULTURE, BLOOD (ROUTINE X 2)
Culture: NO GROWTH
Culture: NO GROWTH
Special Requests: ADEQUATE

## 2019-12-26 NOTE — Progress Notes (Signed)
Pt expired at 13:04, CDS called and give full release. Ref #[id number]

## 2019-12-26 NOTE — Progress Notes (Signed)
*  PRELIMINARY RESULTS* Echocardiogram 2D Echocardiogram has been performed.  Sherrie Sport 12/09/2019, 9:48 AM

## 2019-12-26 NOTE — Progress Notes (Signed)
Called to bedside by nursing due to hypoxia, agonal respirations on the vent, and hypotension with subsequent increasing vasopressor requirements.  Orders for stat CBC, CMP, lactic acid, INR, and chest x-ray.  Patient is adequately sedated, therefore vecuronium was given to help with vent synchrony.  FiO2 was increased to 100% by respiratory therapy along with titration of PEEP to 10.  Respiratory therapy unable to obtain ABG, therefore arterial line placed in the left femoral artery.  Amp of bicarb was given along with 1 L LR bolus.  Patient's SPO2 has increased to 94%, no longer having agonal respirations, and now maintaining MAP greater than 65.  Patient's follow-up lab work shows that hemoglobin is 8.5 posttransfusion.  CMP with serum bicarb 15, creatinine 2.5, anion gap 19, AST 190, ALT 68.  Lactic acid is greater than 11. ABG: 7.15 / 41 / 90 / 14.3.  Additional amp of bicarb given, and bicarb infusion increased to 125 mL/h.  Chest x-ray shows interval worsening of bilateral airspace opacities, with more focal areas of consolidation in the right lower lung zone.  Will broaden antibiotics to Flagyl and vancomycin in addition to Rocephin and azithromycin.  Abdominal exam remains benign (abdomen is not distended and no signs of acute abdomen).  Patient is not stable enough to transport to CT at this time, therefore will obtain KUB at the bedside given severe lactic acidosis.  Pt critically ill with multi-organ failure, prognosis is guarded and at high risk for Cardiac arrest.     Darel Hong, AGACNP-BC [place] Pager: [phone]

## 2019-12-26 NOTE — Progress Notes (Signed)
Pharmacy Antibiotic Note  Susan Fields is a 59 y.o. female admitted on Dec 06, 2019 with pneumonia.  Pharmacy has been consulted for Vancomycin dosing.  Pt received Vancomycin 1000mg  on 12/10 at 0757, pt has decreased renal fxn.  Plan: Vancomycin 1000mg  IV x 1 dose now Additional doses to be determined based on renal fxn and levels due to changing renal fxn Deescalate ABX as appropriate  Height: 5' 2.01" (157.5 cm) Weight: 120 lb (54.4 kg) IBW/kg (Calculated) : 50.12  Temp (24hrs), Avg:97.7 F (36.5 C), Min:92.8 F (33.8 C), Max:100.8 F (38.2 C)  Recent Labs  Lab 12/06/19 0710 12-06-19 0926 12-06-2019 1951 11/26/2019 0239 12/20/2019 0243  WBC 6.4  --  4.8 5.0  --   CREATININE 1.71*  --   --  2.50*  --   LATICACIDVEN 6.2* 5.1*  --   --  >11.0*    Estimated Creatinine Clearance: 19.6 mL/min (A) (by C-G formula based on SCr of 2.5 mg/dL (H)).    Not on File  Antimicrobials this admission: Cefepime 12/10 x 1 Zithromax 12/11 >> Rocephin 12/11 >> Flagyl 12/10 x 1 then 12/11 >> Vancomycin 12/10 x 1 then 12/11 >>  Dose adjustments this admission:   Microbiology results:  BCx:   UCx:    Sputum:    MRSA PCR:   Thank you for allowing pharmacy to be a part of this patient's care.  Hart Robinsons A 12/22/2019 4:05 AM

## 2019-12-26 NOTE — Progress Notes (Signed)
   11/27/2019 1400  Clinical Encounter Type  Visited With Patient and family together  Visit Type Initial;Death;Patient actively dying  Referral From Nurse  Stress Factors  Family Stress Factors Loss  Ch was paged to provide end of life care to the grieving family. Sister, mother, and husband were at bedside. Ch encouraged to do life review of the pt and said a communal prayer. Ch also helped them share memories of the pt. The patient passed and the family were able to make peace with that. The visit was appreciated.

## 2019-12-26 NOTE — Progress Notes (Signed)
Pt was suctioned prior to extubation for a small amount of white secretions. Per Dr. Teodoro Kil order, she was extubated to  Comfort care.

## 2019-12-26 NOTE — Consult Note (Signed)
[address] [place], [place] [address] Phone [phone]. Fax [phone]  Date: 12/19/2019                  Patient Name:  Susan Fields  MRN: [id number]  DOB: [birth year]  Age / Sex: 59 y.o., female         PCP: Patient, No Pcp Per                 Service Requesting Consult: IM/ Ottie Glazier, MD                 Reason for Consult: ARF            History of Present Illness: Patient is a 59 y.o. female with unknown medical problems, who was admitted to [facility] on 12/02/2019 for evaluation of SOB and abdominal pain.  Patient presented to the emergency room by EMS for shortness of breath, abdominal pain since Saturday.  Room air saturation was 68% and was placed on nonrebreather.  Patient was hypotensive with blood pressure in the 90s.  ER noted patient was confused and jaundiced.  Patient was intubated in the ER for airway protection and transferred to ICU for further management. Received broad-spectrum antibiotics cefepime and vancomycin. Work-up so far shows worsening acidosis with lactic acid level of greater than 11 Renal function is worsening.  Admission creatinine is 1.71 which has worsened to 2.50 today Patient is coagulopathic with INR of 4.6 Covid and influenza panel negative HIV screen negative Alcohol less than 10 CT head shows atrophy and chronic microvascular ischemic changes Abdominal x-ray with prominent air-filled small bowel loops suggesting ileus or early obstruction Previous CT from 2009 shows findings consistent with pancreatitis  Medications: Outpatient medications: No medications prior to admission.    Current medications: Current Facility-Administered Medications  Medication Dose Route Frequency Provider Last Rate Last Admin  . acetaminophen (TYLENOL) tablet 650 mg  650 mg Oral Q4H PRN Tyler Pita, MD      . azithromycin (ZITHROMAX) 500 mg in sodium chloride 0.9 % 250 mL IVPB  500 mg Intravenous Q24H Flora Lipps, MD    Stopped at 12/19/2019 2057  . cefTRIAXone (ROCEPHIN) 2 g in sodium chloride 0.9 % 100 mL IVPB  2 g Intravenous Q24H Flora Lipps, MD   Stopped at 11/26/2019 1736  . chlorhexidine gluconate (MEDLINE KIT) (PERIDEX) 0.12 % solution 15 mL  15 mL Mouth Rinse BID Tyler Pita, MD   15 mL at 12/24/2019 2242  . Chlorhexidine Gluconate Cloth 2 % PADS 6 each  6 each Topical Daily Aleskerov, Fuad, MD      . fentaNYL (SUBLIMAZE) bolus via infusion 50 mcg  50 mcg Intravenous Q1H PRN Duffy Bruce, MD      . fentaNYL (SUBLIMAZE) injection 50 mcg  50 mcg Intravenous Once Duffy Bruce, MD   Stopped at 12/06/2019 1052  . fentaNYL 2536mg in NS 2517m(1078mml) infusion-PREMIX  0-400 mcg/hr Intravenous Continuous IsaDuffy BruceD 12.5 mL/hr at 12/Dec 25, 202000 125 mcg/hr at 12/December 25, 202000  . fentaNYL 2500m12mn NS 250mL50mmcg66m infusion-PREMIX  25-400 mcg/hr Intravenous Continuous IsaacsDuffy Bruce Stopped at 12/09/2019 1052  . hydrocortisone sodium succinate (SOLU-CORTEF) 100 MG injection 50 mg  50 mg Intravenous Q6H Kasa, Flora Lipps 50 mg at 12/04/24-Dec-2020 . ipratropium-albuterol (DUONEB) 0.5-2.5 (3) MG/3ML nebulizer solution 3 mL  3 mL Nebulization Q6H GonzalTyler Pita 3 mL at 12/05/11/25/2020 . MEDLINE mouth rinse  15 mL Mouth Rinse 10 times per day Tyler Pita, MD   15 mL at 2020/01/01 0600  . metroNIDAZOLE (FLAGYL) IVPB 500 mg  500 mg Intravenous Q6H Darel Hong D, NP 100 mL/hr at 01/01/2020 0600 Rate Verify at 01/01/20 0600  . midazolam (VERSED) injection 2 mg  2 mg Intravenous Q15 min PRN Duffy Bruce, MD   2 mg at 11/26/2019 0939  . midazolam (VERSED) injection 2 mg  2 mg Intravenous Q2H PRN Duffy Bruce, MD   2 mg at 01-01-20 0009  . nitroGLYCERIN (NITROGLYN) 2 % ointment 1 inch  1 inch Topical Q8H Tyler Pita, MD      . norepinephrine (LEVOPHED) 16 mg in 290m premix infusion  0-40 mcg/min Intravenous Titrated KDarel HongD, NP 18.75 mL/hr at 1January 07, 20210600 20 mcg/min at  101/07/210600  . ondansetron (ZOFRAN) injection 4 mg  4 mg Intravenous Q6H PRN GTyler Pita MD      . pantoprazole (PROTONIX) injection 40 mg  40 mg Intravenous QHS GTyler Pita MD   40 mg at 12/14/2019 2208  . phenylephrine CONCENTRATED 1042min sodium chloride 0.9% 25067m0.4mg59m) infusion  0-400 mcg/min Intravenous Titrated RobbOrene DesanctisH 22.5 mL/hr at 12/1January 07, 20210 150 mcg/min at 12/101-07-210  . propofol (DIPRIVAN) 1000 MG/100ML infusion  5-80 mcg/kg/min Intravenous Titrated KeenDarel HongNP 19.58 mL/hr at 12/1Jan 07, 20210 60 mcg/kg/min at 12/101-07-210  . sodium bicarbonate 150 mEq in sterile water 1,000 mL infusion   Intravenous Continuous KeenDarel HongNP 125 mL/hr at 12/107-Jan-20210 Rate Verify at 12/12021/01/070  . sodium chloride 0.9 % 1,000 mL with thiamine 100 956 folic acid 1 mg infusion   Intravenous Once GonzTyler Pita      . vancomycin variable dose per unstable renal function (pharmacist dosing)   Does not apply See admin instructions HallEna DawleyH      . vasopressin (PITRESSIN) 40 Units in sodium chloride 0.9 % 250 mL (0.16 Units/mL) infusion  0.03 Units/min Intravenous Continuous Kasa, Kurian, MD 11.25 mL/hr at 12/101-07-20210 0.03 Units/min at 12/101-07-210      Allergies: Not on File    Past Medical History: No past medical history on file.    Family History: No family history on file.   Social History: Social History   Socioeconomic History  . Marital status: Married    Spouse name: Not on file  . Number of children: Not on file  . Years of education: Not on file  . Highest education level: Not on file  Occupational History  . Not on file  Tobacco Use  . Smoking status: Not on file  Substance and Sexual Activity  . Alcohol use: Not on file  . Drug use: Not on file  . Sexual activity: Not on file  Other Topics Concern  . Not on file  Social History Narrative  . Not on file   Social Determinants of Health    Financial Resource Strain:   . Difficulty of Paying Living Expenses: Not on file  Food Insecurity:   . Worried About RunnCharity fundraiserthe Last Year: Not on file  . Ran Out of Food in the Last Year: Not on file  Transportation Needs:   . Lack of Transportation (Medical): Not on file  . Lack of Transportation (Non-Medical): Not on file  Physical Activity:   . Days of Exercise per Week: Not on file  . Minutes of  Exercise per Session: Not on file  Stress:   . Feeling of Stress : Not on file  Social Connections:   . Frequency of Communication with Friends and Family: Not on file  . Frequency of Social Gatherings with Friends and Family: Not on file  . Attends Religious Services: Not on file  . Active Member of Clubs or Organizations: Not on file  . Attends Archivist Meetings: Not on file  . Marital Status: Not on file  Intimate Partner Violence:   . Fear of Current or Ex-Partner: Not on file  . Emotionally Abused: Not on file  . Physically Abused: Not on file  . Sexually Abused: Not on file     Review of Systems: Unable to obtain Gen:  HEENT:  CV:  Resp:  GI: GU :  MS:  Derm:    Psych: Heme:  Neuro:  Endocrine  Vital Signs: Blood pressure (!) 80/46, pulse (!) 125, temperature 98.4 F (36.9 C), resp. rate 18, height 5' 2.01" (1.575 m), weight 62 kg, SpO2 95 %.   Intake/Output Summary (Last 24 hours) at 12-Dec-2019 0839 Last data filed at 2019/12/12 0600 Gross per 24 hour  Intake 7176.68 ml  Output 10 ml  Net 7166.68 ml    Weight trends: Filed Weights   12/17/2019 0727 2019/12/12 0500  Weight: 54.4 kg 62 kg    Physical Exam: General:  Critically ill-appearing, appears older than stated age, temporal wasting  HEENT  scleral icterus present, ET tube in place, OG tube  Neck:  No masses  Lungs:  Coarse breath sounds bilaterally, ventilator assisted, FiO2 90%  Heart::  Irregular rhythm  Abdomen:  Distended, ascites, soft  Extremities:  No edema   Neurologic:  Sedated  Skin:  Warm, dry  Access:   Foley:  In place with dark yellow urine small amount       Lab results: Basic Metabolic Panel: Recent Labs  Lab 12/20/2019 0710 12-12-2019 0239  NA 133* 134*  K 3.7 4.2  CL 98 100  CO2 16* 15*  GLUCOSE 119* 109*  BUN 22* 24*  CREATININE 1.71* 2.50*  CALCIUM 8.5* 7.1*    Liver Function Tests: Recent Labs  Lab 12/12/19 0239  AST 190*  ALT 68*  ALKPHOS 115  BILITOT 8.2*  PROT 6.5  ALBUMIN 1.6*   No results for input(s): LIPASE, AMYLASE in the last 168 hours. Recent Labs  Lab 12/23/2019 0710  AMMONIA 46*    CBC: Recent Labs  Lab 12/09/2019 0710 12/14/2019 1951 12-12-19 0239  WBC 6.4 4.8 5.0  NEUTROABS 5.1  --   --   HGB 7.5* 6.2* 8.5*  HCT 24.0* 20.2* 28.0*  MCV 101.7* 103.6* 103.7*  PLT 72* 74* 72*    Cardiac Enzymes: No results for input(s): CKTOTAL, TROPONINI in the last 168 hours.  BNP: Invalid input(s): POCBNP  CBG: Recent Labs  Lab 11/30/2019 1349  GLUCAP 140*    Microbiology: Recent Results (from the past 720 hour(s))  Blood Culture (routine x 2)     Status: None (Preliminary result)   Collection Time: 12/10/2019  7:10 AM   Specimen: BLOOD LEFT ARM  Result Value Ref Range Status   Specimen Description BLOOD LEFT ARM  Final   Special Requests   Final    BOTTLES DRAWN AEROBIC AND ANAEROBIC Blood Culture adequate volume   Culture   Final    NO GROWTH < 24 HOURS Performed at [facility], [address] [place], [place] [address]  Report Status PENDING  Incomplete  Blood Culture (routine x 2)     Status: None (Preliminary result)   Collection Time: 11/29/2019  7:30 AM   Specimen: BLOOD RIGHT ARM  Result Value Ref Range Status   Specimen Description BLOOD RIGHT ARM  Final   Special Requests   Final    BOTTLES DRAWN AEROBIC AND ANAEROBIC Blood Culture results may not be optimal due to an excessive volume of blood received in culture bottles   Culture   Final    NO GROWTH < 24  HOURS Performed at [facility], [address] [place], [place] [address]    Report Status PENDING  Incomplete  Respiratory Panel by RT PCR (Flu A&B, Covid) -     Status: None   Collection Time: 12/14/2019 10:45 AM  Result Value Ref Range Status   SARS Coronavirus 2 by RT PCR NEGATIVE NEGATIVE Final    Comment: (NOTE) SARS-CoV-2 target nucleic acids are NOT DETECTED. The SARS-CoV-2 RNA is generally detectable in upper respiratoy specimens during the acute phase of infection. The lowest concentration of SARS-CoV-2 viral copies this assay can detect is 131 copies/mL. A negative result does not preclude SARS-Cov-2 infection and should not be used as the sole basis for treatment or other patient management decisions. A negative result may occur with  improper specimen collection/handling, submission of specimen other than nasopharyngeal swab, presence of viral mutation(s) within the areas targeted by this assay, and inadequate number of viral copies (<131 copies/mL). A negative result must be combined with clinical observations, patient history, and epidemiological information. The expected result is Negative. Fact Sheet for Patients:  PinkCheek.be Fact Sheet for Healthcare Providers:  GravelBags.it This test is not yet ap proved or cleared by the [place] FDA and  has been authorized for detection and/or diagnosis of SARS-CoV-2 by FDA under an Emergency Use Authorization (EUA). This EUA will remain  in effect (meaning this test can be used) for the duration of the COVID-19 declaration under Section 564(b)(1) of the Act, 21 U.S.C. section 360bbb-3(b)(1), unless the authorization is terminated or revoked sooner.    Influenza A by PCR NEGATIVE NEGATIVE Final   Influenza B by PCR NEGATIVE NEGATIVE Final    Comment: (NOTE) The Xpert Xpress SARS-CoV-2/FLU/RSV assay is intended as an aid in  the diagnosis of influenza  from Nasopharyngeal swab specimens and  should not be used as a sole basis for treatment. Nasal washings and  aspirates are unacceptable for Xpert Xpress SARS-CoV-2/FLU/RSV  testing. Fact Sheet for Patients: PinkCheek.be Fact Sheet for Healthcare Providers: GravelBags.it This test is not yet approved or cleared by the [place] FDA and  has been authorized for detection and/or diagnosis of SARS-CoV-2 by  FDA under an Emergency Use Authorization (EUA). This EUA will remain  in effect (meaning this test can be used) for the duration of the  Covid-19 declaration under Section 564(b)(1) of the Act, 21  U.S.C. section 360bbb-3(b)(1), unless the authorization is  terminated or revoked. Performed at [facility], [address] [place], [place] [address]   MRSA PCR Screening     Status: None   Collection Time: 11/27/2019  2:02 PM   Specimen: Nasal Mucosa; Nasopharyngeal  Result Value Ref Range Status   MRSA by PCR NEGATIVE NEGATIVE Final    Comment:        The GeneXpert MRSA Assay (FDA approved for NASAL specimens only), is one component of a comprehensive MRSA colonization surveillance program. It is not  intended to diagnose MRSA infection nor to guide or monitor treatment for MRSA infections. Performed at [facility], [address] [place], [place] [address]      Coagulation Studies: Recent Labs    11/26/2019 0710 2019/12/22 0239  LABPROT 32.9* 43.7*  INR 3.2* 4.6*    Urinalysis: No results for input(s): COLORURINE, LABSPEC, PHURINE, GLUCOSEU, HGBUR, BILIRUBINUR, KETONESUR, PROTEINUR, UROBILINOGEN, NITRITE, LEUKOCYTESUR in the last 72 hours.  Invalid input(s): APPERANCEUR      Imaging: DG Abd 1 View  Result Date: 12/22/19 CLINICAL DATA:  Lactic acidosis. EXAM: ABDOMEN - 1 VIEW COMPARISON:  Radiograph yesterday. FINDINGS: Tip and side port of the enteric tube below the diaphragm in the  stomach. Decreased gaseous distension from prior exam. Prominent air-filled small bowel in the right abdomen measure up to 2.9 cm. Relative paucity of distal bowel gas. No evidence of free air on supine view. Surgical clips in the right upper quadrant typically cholecystectomy. No radiopaque calculi or abnormal soft tissue calcifications. IMPRESSION: 1. Tip and side port of the enteric tube below the diaphragm in the stomach. 2. Prominent air-filled small bowel loops in the right abdomen may represent ileus or early obstruction. Generalized paucity of distal small bowel gas. Recommend radiographic follow-up. Electronically Signed   By: Keith Rake M.D.   On: December 22, 2019 04:02   DG Abd 1 View  Result Date: 12/11/2019 CLINICAL DATA:  Orogastric tube placement. EXAM: ABDOMEN - 1 VIEW COMPARISON:  None. FINDINGS: The bowel gas pattern is normal. Status post cholecystectomy. Distal tip of enteric tube is seen in distal stomach. No radio-opaque calculi or other significant radiographic abnormality are seen. IMPRESSION: Distal tip of enteric tube is seen in distal stomach. No evidence of bowel obstruction or ileus. Electronically Signed   By: Marijo Conception M.D.   On: 11/25/2019 15:14   CT Head Wo Contrast  Result Date: 12/20/2019 CLINICAL DATA:  Altered mental status.  Hypoxia. EXAM: CT HEAD WITHOUT CONTRAST TECHNIQUE: Contiguous axial images were obtained from the base of the skull through the vertex without intravenous contrast. COMPARISON:  None. FINDINGS: Brain: No evidence of acute infarction, hemorrhage, hydrocephalus, extra-axial collection or mass lesion/mass effect. Cortical atrophy and chronic microvascular ischemic change are noted. Vascular: No hyperdense vessel or unexpected calcification. Skull: Intact.  No focal lesion. Sinuses/Orbits: Negative. Other: None. IMPRESSION: No acute abnormality. Atrophy and chronic microvascular ischemic change. Electronically Signed   By: Inge Rise M.D.    On: 12/16/2019 12:43   DG Chest Port 1 View  Result Date: 2019/12/22 CLINICAL DATA:  Acute respiratory failure EXAM: PORTABLE CHEST 1 VIEW COMPARISON:  December 04, 2019 FINDINGS: The right-sided central venous catheter tip is well positioned terminating near the cavoatrial junction. There is no pneumothorax. The endotracheal tube terminates above the carina. The enteric tube extends below the left hemidiaphragm. There are worsening bilateral pulmonary opacities, with more focal areas of consolidation now at the right lung base. There may be small bilateral pleural effusions. IMPRESSION: 1. Lines and tubes as above. 2. Interval worsening of bilateral airspace opacities, now with more focal areas consolidation in the right lower lung zone. Electronically Signed   By: Constance Holster M.D.   On: 2019/12/22 03:36   DG Chest Port 1 View  Result Date: 12/24/2019 CLINICAL DATA:  Central line placement. EXAM: PORTABLE CHEST 1 VIEW COMPARISON:  Same day. FINDINGS: The heart size and mediastinal contours are within normal limits. Endotracheal and nasogastric tubes are in grossly good position. Interval placement of  right internal jugular catheter with distal tip in expected position of the right atrium. No pneumothorax or pleural effusion is noted. Bilateral lung opacities are noted concerning for pneumonia or edema. The visualized skeletal structures are unremarkable. IMPRESSION: Endotracheal and nasogastric tubes are in grossly good position. Interval placement of right internal jugular catheter with distal tip in expected position of right atrium. No pneumothorax is noted. Stable bilateral lung opacities as described above. Electronically Signed   By: Marijo Conception M.D.   On: 12/25/2019 15:14   DG Chest Portable 1 View  Result Date: 12/06/2019 CLINICAL DATA:  Respiratory failure EXAM: PORTABLE CHEST 1 VIEW COMPARISON:  Radiograph same day FINDINGS: The heart size and mediastinal contours are unchanged.  Again noted are bilateral ground-glass airspace opacities. There is interval placement of ET tube 7 mm above the level of the carina. A fracture deformity of the left shoulder seen. IMPRESSION: ETT 7 mm above the level of the carina. Diffuse bilateral hazy/ground-glass opacities throughout both lungs. This could be due to multifocal infectious etiology, asymmetric edema, and/or early ARDS. Electronically Signed   By: Prudencio Pair M.D.   On: 12/03/2019 10:11   DG Chest Port 1 View  Result Date: 11/26/2019 CLINICAL DATA:  Sepsis.  Shortness of breath.  Abdominal pain. EXAM: PORTABLE CHEST 1 VIEW COMPARISON:  No recent prior. FINDINGS: Mediastinum hilar structures normal. Heart size normal. Diffuse severe bilateral pulmonary interstitial infiltrates are noted. No pleural effusion or pneumothorax. Thoracic spine scoliosis concave left. Surgical clips right upper quadrant. Mild gastric distention. IMPRESSION: 1. Diffuse severe bilateral pulmonary interstitial infiltrates are noted. 2.  Mild gastric distention. Electronically Signed   By: Marcello Moores  Register   On: 12/17/2019 08:05      Assessment & Plan: Pt is a 59 y.o. African-American  female with known medical problems, was admitted on 12/13/2019 with shortness of breath, abdominal pain.  Hospital course complicated by acute respiratory failure requiring intubation in the emergency room   #Acute kidney injury -Likely secondary to hypotension, underlying sepsis -Patient is oliguric with increasing creatinine -No acute indication for dialysis at present but will monitor closely -Consider renal ultrasound versus CT of the abdomen  #Sepsis with severe lactic acidosis, bilateral pneumonia Currently treated with broad-spectrum antibiotics  #Ascites, jaundice, coagulopathy -Patient appears to have underlying liver disease -Consider imaging  #Failure to thrive, malnutrition -Albumin is low at 1.6, patient has temporal wasting, evidence of poor  nutrition -Consider palliative care -We will need more information on her medical history from family    #Acute respiratory failure -Bilateral pneumonia and septic shock -Ventilator assisted.  Intubated 12/23/2019 -FiO2 90%   Case d/w Nurse Roselyn Reef and Dr Lanney Gins    LOS: [place] 18-Dec-20208:39 AM    Note: This note was prepared with Dragon dictation. Any transcription errors are unintentional

## 2019-12-26 NOTE — Discharge Summary (Signed)
Patient is a 59 year old woman who presented with dyspnea and altered mental status.  History cannot be obtained from the patient.  There is no family members present.  Patient has no significant records in Epic.  Per the ED physicians report, EMS was called at the scene due to shortness of breath.  Apparently the patient called EMS.  He was noted to be hypoxic with oxygen saturations in the 60s on room air.  She had obvious increased work of breathing according to EMS.  The patient was placed on nonrebreather with improvement of saturations at 90 to 92%.  She was noted to be confused and apparently exhibited echolalia.  Uncertain how long the patient had been ill.  Patient could not give significant medical history to ED physician.  The patient had to be intubated in the emergency room due to worsening respiratory distress and hypoxia.  No further history is available.  Goals of care discussion with husband and mother at bedside. Explained patient is in septic shock with multiorgan failure and no neurologic activity while intubated without any sedation. They have discussed among themselves and wished for patient to be treated with comfort meaures only and proceed with compassionate weaning from mechanical ventilation.  Additional family members were asked to come in to have opportunity to see patient once more and pray. She passed away at 1304, may she rest in peace.    Ottie Glazier, M.D.  [facility]

## 2019-12-26 NOTE — Procedures (Signed)
Arterial Catheter Insertion Procedure Note LAURABETH YIP [id number] 29-May-1961  Procedure: Insertion of Arterial Catheter  Indications: Blood pressure monitoring and Frequent blood sampling  Procedure Details Consent: Unable to obtain consent because of emergent medical necessity. Time Out: Verified patient identification, verified procedure, site/side was marked, verified correct patient position, special equipment/implants available, medications/allergies/relevent history reviewed, required imaging and test results available.  Performed  Maximum sterile technique was used including antiseptics, cap, gloves, gown, hand hygiene, mask and sheet. Skin prep: Chlorhexidine; local anesthetic administered 20 gauge catheter was inserted into left femoral artery using the Seldinger technique. ULTRASOUND GUIDANCE USED: YES Evaluation Blood flow good; BP tracing good. Complications: No apparent complications.   Procedure was performed using Ultrasound for direct visualization of cannulization of Left Femoral Artery.    Darel Hong, AGACNP-BC [facility] Pager: [phone]  Bradly Bienenstock 01/01/2020

## 2019-12-26 DEATH — deceased

## 2021-03-16 IMAGING — DX DG CHEST 1V PORT
1 series · 1 of 1 positions shown · non-contrast
Comparison: December 04, 2019

CLINICAL DATA: Acute respiratory failure

EXAM:
PORTABLE CHEST 1 VIEW

[chest ap]
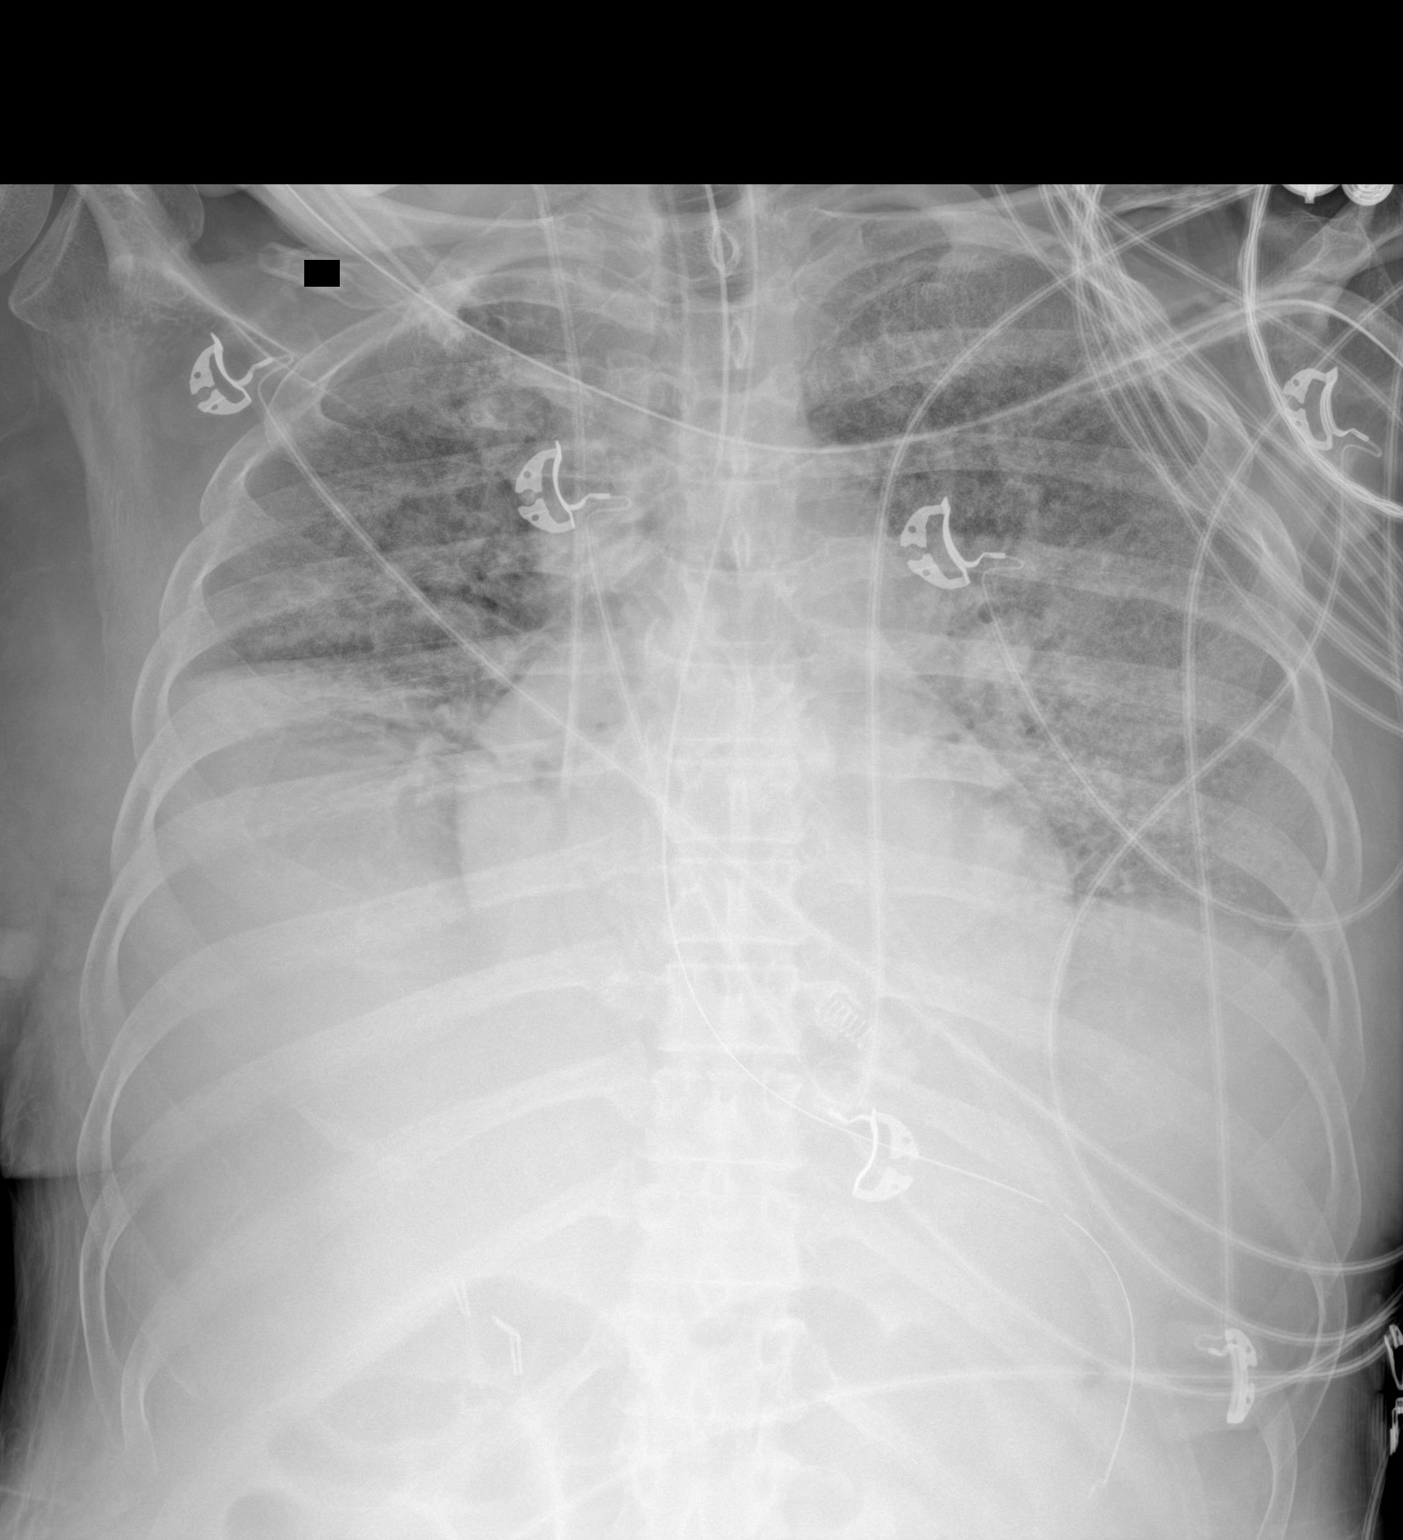

[1 of 1 positions shown; findings below may reference images not displayed]

FINDINGS: The right-sided central venous catheter tip is well positioned
terminating near the cavoatrial junction. There is no pneumothorax.
The endotracheal tube terminates above the carina. The enteric tube
extends below the left hemidiaphragm. There are worsening bilateral
pulmonary opacities, with more focal areas of consolidation now at
the right lung base. There may be small bilateral pleural effusions.
IMPRESSION: 1. Lines and tubes as above.
2. Interval worsening of bilateral airspace opacities, now with more
focal areas consolidation in the right lower lung zone.
